# Patient Record
Sex: Female | Born: 1990 | Race: Black or African American | Hispanic: No | Marital: Single | Smoking: Never smoker
Health system: Southern US, Community
[De-identification: ages and names within clinical notes are randomized; demographics above are authoritative.]

## PROBLEM LIST (undated history)

## (undated) HISTORY — PX: WISDOM TOOTH EXTRACTION: SHX21

---
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
## 2010-06-15 ENCOUNTER — Ambulatory Visit: Payer: Self-pay | Admitting: Psychology
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
## 2011-08-02 LAB — BASIC METABOLIC PANEL
CO2: 20
CO2: 22
Calcium: 9.3
Chloride: 110
Glucose, Bld: 63 — ABNORMAL LOW
Potassium: 4.3
Sodium: 137
Sodium: 139

## 2011-08-02 LAB — CBC
Hemoglobin: 12.5
MCHC: 32.7
MCHC: 33
MCV: 79.6
Platelets: 245
RBC: 4.35
RBC: 4.81
RDW: 15.1
WBC: 10
WBC: 7.5

## 2011-08-02 LAB — TYPE AND SCREEN: ABO/RH(D): A POS

## 2011-08-02 LAB — POCT I-STAT 3, ART BLOOD GAS (G3+)
Bicarbonate: 20.9
Bicarbonate: 22.5
O2 Saturation: 58
Operator id: 281221
Operator id: 299431
Patient temperature: 100.1
pCO2 arterial: 40
pH, Arterial: 7.34 — ABNORMAL LOW
pH, Arterial: 7.402 — ABNORMAL HIGH
pO2, Arterial: 33 — CL
pO2, Arterial: 374 — ABNORMAL HIGH

## 2011-08-02 LAB — I-STAT EC8
BUN: 12
Chloride: 107
HCT: 40
pCO2 arterial: 44.9
pH, Arterial: 7.296 — ABNORMAL LOW

## 2011-08-02 LAB — PROTIME-INR: Prothrombin Time: 14.6

## 2011-08-02 LAB — HCG, SERUM, QUALITATIVE: Preg, Serum: NEGATIVE

## 2012-08-01 ENCOUNTER — Ambulatory Visit (INDEPENDENT_AMBULATORY_CARE_PROVIDER_SITE_OTHER): Payer: BC Managed Care – PPO | Admitting: Internal Medicine

## 2012-08-01 DIAGNOSIS — Z789 Other specified health status: Secondary | ICD-10-CM

## 2012-08-04 NOTE — Progress Notes (Signed)
Patient ID: Lisa Stephenson, female   DOB: 1991/02/13, 21 y.o.   MRN: [id number]  Here for pretravel visit.  Going to [place] for 3 months for student exchange.  Never before traveled outside of the country.  Never on airplane.  No medicines and no medical problems.  Here with both parents.  Up to date with vaccinations except flu shot.  Has international medical insurance.    Appropriate advice given regarding safety, money, health, travel.  All questions answered.  To get flu shot at local pharmacy.  To assure that hepatitis A vaccine is up to date, only one is documented.

## 2013-03-02 ENCOUNTER — Encounter: Payer: Self-pay | Admitting: *Deleted

## 2013-03-27 ENCOUNTER — Other Ambulatory Visit (HOSPITAL_COMMUNITY)
Admission: RE | Admit: 2013-03-27 | Discharge: 2013-03-27 | Disposition: A | Discharge status: Home / Self Care | Payer: BC Managed Care – PPO | Source: Ambulatory Visit | Attending: Family Medicine | Admitting: Family Medicine

## 2013-03-27 ENCOUNTER — Other Ambulatory Visit: Payer: Self-pay | Admitting: Family Medicine

## 2013-03-27 DIAGNOSIS — Z124 Encounter for screening for malignant neoplasm of cervix: Secondary | ICD-10-CM | POA: Insufficient documentation

## 2019-01-07 ENCOUNTER — Ambulatory Visit (INDEPENDENT_AMBULATORY_CARE_PROVIDER_SITE_OTHER): Payer: BC Managed Care – PPO | Admitting: Family Medicine

## 2019-01-07 ENCOUNTER — Encounter: Payer: Self-pay | Admitting: Family Medicine

## 2019-01-07 VITALS — BP 132/78 | HR 94 | Resp 17 | Ht 61.0 in | Wt 181.0 lb

## 2019-01-07 DIAGNOSIS — Z3202 Encounter for pregnancy test, result negative: Secondary | ICD-10-CM

## 2019-01-07 DIAGNOSIS — Z13220 Encounter for screening for lipoid disorders: Secondary | ICD-10-CM

## 2019-01-07 DIAGNOSIS — Z7689 Persons encountering health services in other specified circumstances: Secondary | ICD-10-CM

## 2019-01-07 DIAGNOSIS — E669 Obesity, unspecified: Secondary | ICD-10-CM | POA: Diagnosis not present

## 2019-01-07 DIAGNOSIS — Z131 Encounter for screening for diabetes mellitus: Secondary | ICD-10-CM

## 2019-01-07 DIAGNOSIS — Z6834 Body mass index (BMI) 34.0-34.9, adult: Secondary | ICD-10-CM

## 2019-01-07 DIAGNOSIS — Z1329 Encounter for screening for other suspected endocrine disorder: Secondary | ICD-10-CM

## 2019-01-07 DIAGNOSIS — Z1389 Encounter for screening for other disorder: Secondary | ICD-10-CM | POA: Diagnosis not present

## 2019-01-07 DIAGNOSIS — Z862 Personal history of diseases of the blood and blood-forming organs and certain disorders involving the immune mechanism: Secondary | ICD-10-CM | POA: Diagnosis not present

## 2019-01-07 DIAGNOSIS — Z0000 Encounter for general adult medical examination without abnormal findings: Secondary | ICD-10-CM

## 2019-01-07 LAB — POCT URINALYSIS DIP (CLINITEK)
Bilirubin, UA: NEGATIVE
Blood, UA: NEGATIVE
Glucose, UA: NEGATIVE mg/dL
Ketones, POC UA: NEGATIVE mg/dL
Leukocytes, UA: NEGATIVE
Nitrite, UA: NEGATIVE
POC PROTEIN,UA: NEGATIVE
Spec Grav, UA: 1.025 (ref 1.010–1.025)
Urobilinogen, UA: 0.2 E.U./dL
pH, UA: 6 (ref 5.0–8.0)

## 2019-01-07 LAB — POCT URINE PREGNANCY: Preg Test, Ur: NEGATIVE

## 2019-01-07 NOTE — Patient Instructions (Incomplete)
Thank you for choosing Primary Care at [facility] to be your medical home!    Lisa Stephenson was seen by Joaquin Courts, FNP today.   Lisa Stephenson's primary care provider is Bing Neighbors, FNP.   For the best care possible, you should try to see Joaquin Courts, FNP-C whenever you come to the clinic.   We look forward to seeing you again soon!  If you have any questions about your visit today, please call us at [phone] or feel free to reach your primary care provider via MyChart.   Marland KitchenKeeping You Healthy  Get These Tests 1. Blood Pressure- Have your blood pressure checked once a year by your health care provider.  Normal blood pressure is 120/80. 2. Weight- Have your body mass index (BMI) calculated to screen for obesity.  BMI is measure of body fat based on height and weight.  You can also calculate your own BMI at https://www.west-esparza.com/. 3. Cholesterol- Have your cholesterol checked every 5 years starting at age 6 then yearly starting at age 49. 4. Chlamydia, HIV, and other sexually transmitted diseases- Get screened every year until age 28, then within three months of each new sexual provider. 5. Pap Test - Every 1-5 years; discuss with your health care provider. 6. Mammogram- Every 1-2 years starting at age 11--50  Take these medicines  Calcium with Vitamin D-Your body needs 1200 mg of Calcium each day and [phone] IU of Vitamin D daily.  Your body can only absorb 500 mg of Calcium at a time so Calcium must be taken in 2 or 3 divided doses throughout the day.  Multivitamin with folic acid- Once daily if it is possible for you to become pregnant.  Get these Immunizations  Gardasil-Series of three doses; prevents HPV related illness such as genital warts and cervical cancer.  Menactra-Single dose; prevents meningitis.  Tetanus shot- Every 10 years.  Flu shot-Every year.  Take these steps 1. Do not smoke-Your healthcare provider can help you quit.  For tips  on how to quit go to www.smokefree.gov or call 1-800 QUITNOW. 2. Be physically active- Exercise 5 days a week for at least 30 minutes.  If you are not already physically active, start slow and gradually work up to 30 minutes of moderate physical activity.  Examples of moderate activity include walking briskly, dancing, swimming, bicycling, etc. 3. Breast Cancer- A self breast exam every month is important for early detection of breast cancer.  For more information and instruction on self breast exams, ask your healthcare provider or [place].es. 4. Eat a healthy diet- Eat a variety of healthy foods such as fruits, vegetables, whole grains, low fat milk, low fat cheeses, yogurt, lean meats, poultry and fish, beans, nuts, tofu, etc.  For more information go to www. Thenutritionsource.org 5. Drink alcohol in moderation- Limit alcohol intake to one drink or less per day. Never drink and drive. 6. Depression- Your emotional health is as important as your physical health.  If you're feeling down or losing interest in things you normally enjoy please talk to your healthcare provider about being screened for depression. 7. Dental visit- Brush and floss your teeth twice daily; visit your dentist twice a year. 8. Eye doctor- Get an eye exam at least every 2 years. 9. Helmet use- Always wear a helmet when riding a bicycle, motorcycle, rollerblading or skateboarding. 10. Safe sex- If you may be exposed to sexually transmitted infections, use a condom. 11. Seat belts- Seat belts can save your live;  always wear one. 12. Smoke/Carbon Monoxide detectors- These detectors need to be installed on the appropriate level of your home. Replace batteries at least once a year. 13. Skin cancer- When out in the sun please cover up and use sunscreen 15 SPF or higher. 14. Violence- If anyone is threatening or hurting you, please tell your healthcare provider.
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
## 2019-01-08 ENCOUNTER — Other Ambulatory Visit: Payer: Self-pay | Admitting: Family Medicine

## 2019-01-08 ENCOUNTER — Telehealth: Payer: Self-pay | Admitting: Family Medicine

## 2019-01-08 LAB — HEMOGLOBIN A1C
Est. average glucose Bld gHb Est-mCnc: 123 mg/dL
Hgb A1c MFr Bld: 5.9 % — ABNORMAL HIGH (ref 4.8–5.6)

## 2019-01-08 LAB — CBC WITH DIFFERENTIAL/PLATELET
BASOS ABS: 0 10*3/uL (ref 0.0–0.2)
Basos: 1 %
EOS (ABSOLUTE): 0 10*3/uL (ref 0.0–0.4)
Eos: 0 %
Hematocrit: 30.3 % — ABNORMAL LOW (ref 34.0–46.6)
Hemoglobin: 9 g/dL — ABNORMAL LOW (ref 11.1–15.9)
IMMATURE GRANS (ABS): 0 10*3/uL (ref 0.0–0.1)
Immature Granulocytes: 0 %
Lymphocytes Absolute: 2.2 10*3/uL (ref 0.7–3.1)
Lymphs: 35 %
MCH: 20.4 pg — ABNORMAL LOW (ref 26.6–33.0)
MCHC: 29.7 g/dL — ABNORMAL LOW (ref 31.5–35.7)
MCV: 69 fL — ABNORMAL LOW (ref 79–97)
Monocytes Absolute: 0.4 10*3/uL (ref 0.1–0.9)
Monocytes: 6 %
NEUTROS ABS: 3.6 10*3/uL (ref 1.4–7.0)
Neutrophils: 58 %
Platelets: 547 10*3/uL — ABNORMAL HIGH (ref 150–450)
RBC: 4.42 x10E6/uL (ref 3.77–5.28)
RDW: 17 % — ABNORMAL HIGH (ref 11.7–15.4)
WBC: 6.2 10*3/uL (ref 3.4–10.8)

## 2019-01-08 LAB — IRON,TIBC AND FERRITIN PANEL
Ferritin: 4 ng/mL — ABNORMAL LOW (ref 15–150)
Iron Saturation: 3 % — CL (ref 15–55)
Iron: 14 ug/dL — ABNORMAL LOW (ref 27–159)
Total Iron Binding Capacity: 443 ug/dL (ref 250–450)
UIBC: 429 ug/dL — ABNORMAL HIGH (ref 131–425)

## 2019-01-08 LAB — COMPREHENSIVE METABOLIC PANEL
ALT: 30 IU/L (ref 0–32)
AST: 16 IU/L (ref 0–40)
Albumin/Globulin Ratio: 1.5 (ref 1.2–2.2)
Albumin: 4.5 g/dL (ref 3.9–5.0)
Alkaline Phosphatase: 108 IU/L (ref 39–117)
BUN/Creatinine Ratio: 11 (ref 9–23)
BUN: 9 mg/dL (ref 6–20)
Bilirubin Total: <0.2 mg/dL (ref 0.0–1.2)
CO2: 23 mmol/L (ref 20–29)
Calcium: 10 mg/dL (ref 8.7–10.2)
Chloride: 104 mmol/L (ref 96–106)
Creatinine, Ser: 0.81 mg/dL (ref 0.57–1.00)
GFR calc Af Amer: 114 mL/min/{1.73_m2} (ref >59)
GFR calc non Af Amer: 99 mL/min/{1.73_m2} (ref >59)
Globulin, Total: 3.1 g/dL (ref 1.5–4.5)
Glucose: 87 mg/dL (ref 65–99)
Potassium: 4.6 mmol/L (ref 3.5–5.2)
Sodium: 141 mmol/L (ref 134–144)
Total Protein: 7.6 g/dL (ref 6.0–8.5)

## 2019-01-08 LAB — LIPID PANEL
Chol/HDL Ratio: 3.6 ratio (ref 0.0–4.4)
Cholesterol, Total: 223 mg/dL — ABNORMAL HIGH (ref 100–199)
HDL: 62 mg/dL (ref >39)
LDL Calculated: 144 mg/dL — ABNORMAL HIGH (ref 0–99)
Triglycerides: 83 mg/dL (ref 0–149)
VLDL CHOLESTEROL CAL: 17 mg/dL (ref 5–40)

## 2019-01-08 LAB — THYROID PANEL WITH TSH
FREE THYROXINE INDEX: 2.1 (ref 1.2–4.9)
T3 Uptake Ratio: 27 % (ref 24–39)
T4, Total: 7.9 ug/dL (ref 4.5–12.0)
TSH: 1.39 u[IU]/mL (ref 0.450–4.500)

## 2019-01-08 MED ORDER — FERROUS SULFATE 325 (65 FE) MG PO TABS: 325.0000 mg | ORAL_TABLET | Freq: Three times a day (TID) | ORAL | 3 refills | Status: DC

## 2019-01-08 NOTE — Telephone Encounter (Signed)
Patient notified of lab results

## 2019-01-08 NOTE — Progress Notes (Signed)
Ferrous 325 mg TID ordered and sent to pharmacy on file.

## 2019-01-08 NOTE — Telephone Encounter (Signed)
Please contact patient to advise her iron level was extremely low. I would like her to resume oral iron tablets 3 times daily until her follow-up with me in office on March.  I will recheck her hemoglobin and iron level at that time. Her hemoglobin is also low at 9 (should be 11.1). It is important that she take iron daily 3 times daily to increase iron and hemoglobin level.  Other lab: thyroid, kidney, liver, and electrolyte function is normal. Hemoglobin A1C indicates prediabetes at 5.9 and cholesterol (total cholesterol and bad cholesterol) were both elevated. No medication indicated to manage cholesterol or prediabetes. Presently, I recommend lifestyle changes such as engaging in routine physical activity with a goal of 150 minutes per week, increasing intake of vegetables, fruits, fiber, and selecting lean cuts of meat.    Joaquin Courts, FNP Primary Care at [facility] [address] [place] [address] [phone]: [phone]

## 2019-01-08 NOTE — Telephone Encounter (Signed)
Left voice mail to call back 

## 2019-02-04 ENCOUNTER — Ambulatory Visit: Payer: BC Managed Care – PPO | Admitting: Family Medicine

## 2019-08-20 ENCOUNTER — Ambulatory Visit (INDEPENDENT_AMBULATORY_CARE_PROVIDER_SITE_OTHER): Payer: BC Managed Care – PPO

## 2019-08-20 ENCOUNTER — Other Ambulatory Visit: Payer: Self-pay

## 2019-08-20 ENCOUNTER — Encounter: Payer: Self-pay | Admitting: Emergency Medicine

## 2019-08-20 ENCOUNTER — Ambulatory Visit
Admission: EM | Admit: 2019-08-20 | Discharge: 2019-08-20 | Disposition: A | Discharge status: Home / Self Care | Payer: BC Managed Care – PPO | Attending: Physician Assistant | Admitting: Physician Assistant

## 2019-08-20 DIAGNOSIS — M25562 Pain in left knee: Secondary | ICD-10-CM

## 2019-08-20 MED ORDER — MELOXICAM 7.5 MG PO TABS: 7.5000 mg | ORAL_TABLET | Freq: Every day | ORAL | 0 refills | Status: DC

## 2019-08-20 NOTE — ED Provider Notes (Signed)
[facility]    CSN: [id number] Arrival date & time: 08/20/19  3267      History   Chief Complaint Chief Complaint  Patient presents with  . Knee Pain    HPI Lisa Stephenson is a 28 y.o. female.   28 year old female comes in for left knee pain after hyperextended knee last night. States was stepping on the curb when she hyperextended, and has not been able to bear weight since. She has swelling and tenderness inferior to the patella. She is able to flex and extend, though with pain. Denies radiation of pain, numbness/tingling.  States range of motion, swelling, pain has improved from last night, but still unable to weight-bear fully.     History reviewed. No pertinent past medical history.  There are no active problems to display for this patient.   Past Surgical History:  Procedure Laterality Date  . WISDOM TOOTH EXTRACTION      OB History   No obstetric history on file.      Home Medications    Prior to Admission medications   Medication Sig Start Date End Date Taking? Authorizing Provider  ferrous sulfate (FERROUSUL) 325 (65 FE) MG tablet Take 1 tablet (325 mg total) by mouth 3 (three) times daily with meals. 01/08/19   Scot Jun, FNP  meloxicam (MOBIC) 7.5 MG tablet Take 1 tablet (7.5 mg total) by mouth daily. 08/20/19   Tasia Catchings, Ralph Benavidez V, PA-C  Multiple Vitamin (MULTIVITAMIN) tablet Take 1 tablet by mouth daily.    [provider]    Family History Family History  Problem Relation Age of Onset  . Hyperlipidemia Mother   . Hypertension Mother   . Hyperlipidemia Father   . Diabetes Paternal Uncle     Social History Social History   Tobacco Use  . Smoking status: Never Smoker  . Smokeless tobacco: Never Used  Substance Use Topics  . Alcohol use: Yes  . Drug use: Never     Allergies   Patient has no known allergies.   Review of Systems Review of Systems  Reason unable to perform ROS: See HPI as above.     Physical  Exam Triage Vital Signs ED Triage Vitals  Enc Vitals Group     BP 08/20/19 0903 (!) 151/99     Pulse Rate 08/20/19 0903 75     Resp 08/20/19 0903 16     Temp 08/20/19 0903 98.5 F (36.9 C)     Temp Source 08/20/19 0903 Oral     SpO2 08/20/19 0903 98 %     Weight --      Height --      Head Circumference --      Peak Flow --      Pain Score 08/20/19 0905 7     Pain Loc --      Pain Edu? --      Excl. in [place]? --    No data found.  Updated Vital Signs BP (!) 151/99 (BP Location: Left Arm)   Pulse 75   Temp 98.5 F (36.9 C) (Oral)   Resp 16   LMP 08/13/2019   SpO2 98%   Physical Exam Constitutional:      General: She is not in acute distress.    Appearance: She is well-developed. She is not diaphoretic.  HENT:     Head: Normocephalic and atraumatic.  Eyes:     Conjunctiva/sclera: Conjunctivae normal.     Pupils: Pupils are equal, round,  and reactive to light.  Pulmonary:     Effort: Pulmonary effort is normal. No respiratory distress.  Musculoskeletal:     Comments: Swelling to the medial left knee without erythema, warmth, contusion.  Tenderness to palpation of patellar tendon/tibial tuberosity, lateral joint line.  Full range of motion of the knee.  Strength deferred.  Sensation intact and equal bilaterally.  Neurological:     Mental Status: She is alert and oriented to person, place, and time.      UC Treatments / Results  Labs (all labs ordered are listed, but only abnormal results are displayed) Labs Reviewed - No data to display  EKG   Radiology Dg Knee Complete 4 Views Left  Result Date: 08/20/2019 CLINICAL DATA:  Hyperextension, unable to bear weight EXAM: LEFT KNEE - COMPLETE 4+ VIEW COMPARISON:  None. FINDINGS: No fracture or dislocation of the left knee. Joint spaces are well preserved. There is a large, nonspecific knee joint effusion. Soft tissues are unremarkable. IMPRESSION: No fracture or dislocation of the left knee. Joint spaces are well  preserved. There is a large, nonspecific knee joint effusion. Electronically Signed   By: Lauralyn Primes M.D.   On: 08/20/2019 09:51    Procedures Procedures (including critical care time)  Medications Ordered in UC Medications - No data to display  Initial Impression / Assessment and Plan / UC Course  I have reviewed the triage vital signs and the nursing notes.  Pertinent labs & imaging results that were available during my care of the patient were reviewed by me and considered in my medical decision making (see chart for details).    X-ray negative for fracture or dislocation.  Discussed crutches, knee sleeve, NSAIDs, ice compress, rest.  Patient declined crutches and knee sleeves.  Will start patient on Mobic and patient to follow up with orthopedics for further evaluation needed. Return precautions given.  Final Clinical Impressions(s) / UC Diagnoses   Final diagnoses:  Acute pain of left knee   ED Prescriptions    Medication Sig Dispense Auth. Provider   meloxicam (MOBIC) 7.5 MG tablet Take 1 tablet (7.5 mg total) by mouth daily. 14 tablet Belinda Fisher, PA-C     PDMP not reviewed this encounter.   Belinda Fisher, PA-C 08/20/19 1302

## 2019-08-20 NOTE — ED Triage Notes (Signed)
Patient presents to [facility] for assessment after stepping on the [place] and states her knee hyperextended, andf she has been unable to bear weight since last night.  Patient c/o swelling, and tenderness directly under the patella.

## 2019-08-20 NOTE — Discharge Instructions (Signed)
X-ray negative for fracture or dislocation.  Start Mobic as directed.  Ice compress, elevation, knee sleeve during activity.  Can use crutches for symptomatic relief.  Follow-up with orthopedics if symptoms not improving in 1 week.

## 2019-08-20 NOTE — ED Notes (Signed)
Patient refused crutches and knee sleeve.  States she will find these items on her own.  Ice pack provided for home use.

## 2019-09-14 ENCOUNTER — Telehealth: Payer: Self-pay

## 2019-09-14 NOTE — Telephone Encounter (Signed)

## 2019-09-15 ENCOUNTER — Ambulatory Visit (INDEPENDENT_AMBULATORY_CARE_PROVIDER_SITE_OTHER): Payer: BC Managed Care – PPO | Admitting: Internal Medicine

## 2019-09-15 ENCOUNTER — Encounter: Payer: Self-pay | Admitting: Internal Medicine

## 2019-09-15 ENCOUNTER — Other Ambulatory Visit: Payer: Self-pay

## 2019-09-15 VITALS — BP 150/100 | HR 70 | Temp 97.2°F | Resp 17 | Wt 183.0 lb

## 2019-09-15 DIAGNOSIS — Z23 Encounter for immunization: Secondary | ICD-10-CM | POA: Diagnosis not present

## 2019-09-15 DIAGNOSIS — R030 Elevated blood-pressure reading, without diagnosis of hypertension: Secondary | ICD-10-CM

## 2019-09-15 DIAGNOSIS — M25562 Pain in left knee: Secondary | ICD-10-CM

## 2019-09-15 DIAGNOSIS — E66811 Obesity, class 1: Secondary | ICD-10-CM

## 2019-09-15 DIAGNOSIS — E669 Obesity, unspecified: Secondary | ICD-10-CM

## 2019-09-15 DIAGNOSIS — Z6834 Body mass index (BMI) 34.0-34.9, adult: Secondary | ICD-10-CM | POA: Insufficient documentation

## 2019-09-15 DIAGNOSIS — M25561 Pain in right knee: Secondary | ICD-10-CM

## 2019-09-15 MED ORDER — DICLOFENAC SODIUM 1 % TD GEL: 4.0000 g | Freq: Four times a day (QID) | TRANSDERMAL | 1 refills | Status: DC

## 2019-09-15 NOTE — Progress Notes (Signed)
States that she's following up on knee pain. Was seen at urgent care for L knee pain 1 month ago. Hasn't noticed much improvement. R knee is starting to hurt as well from having to accommodate L knee.  Would also like to discuss weight loss. States that her goal weight is 120. States that she is meal planning & eating more at home. Not able to exercise as much due to B knee pain.  Will get flu shot today.

## 2019-09-15 NOTE — Progress Notes (Addendum)
Patient ID: Lisa Stephenson, female    DOB: 07-11-1991  MRN: [id number]  CC: Weight Check   Subjective: Lisa Stephenson is a 28 y.o. female who presents for knee pain and f/u on wgh issue. Her concerns today include:  Patient with history of iron deficiency anemia, HL, prediabetes, obesity  Patient was seen in urgent care 08/20/2019 with left knee pain after she hyperextended the knee while stepping on a curb the night before.  Noted to have swelling and tenderness at that time of exam.  X-ray was done and revealed large knee joint effusion but no fracture or dislocation.  Patient was advised on using crutches and a knee sleeve but she declined those.  She was given meloxicam and told to follow-up with orthopedics.  Took Meloxicam for 2 wks. -Meloxicam helped but was making her dizzy and tired.  -She did ice it and wore a knee support.  The swelling went down. However, she still gets some pain in the knee with any amount of walking.   Some pain in RT knee now.  She has been putting more wgh on the RT knee.   She was walking 2 miles 4 days a week for exercise prior to this injury.  She would like to get back to her baseline so that she can start exercising again.     Wanting to get wgh down.  Goal is to get to 120 lbs Before knee injury she was walking 2 miles 4 days a wk.  After injury she started doing upper body exercises.  Cooking more at home and eating more veggies.  She has cut out sodas.   BP elev today and on visit to UC last mth.  No chronic HA, SOB, CP.  Mom has HTN Took some Aleve twice last wk.     Current Outpatient Medications on File Prior to Visit  Medication Sig Dispense Refill  . ferrous sulfate (FERROUSUL) 325 (65 FE) MG tablet Take 1 tablet (325 mg total) by mouth 3 (three) times daily with meals. 90 tablet 3  . Multiple Vitamin (MULTIVITAMIN) tablet Take 1 tablet by mouth daily.     No current facility-administered medications on file prior to visit.     No  Known Allergies  Social History   Socioeconomic History  . Marital status: Single    Spouse name: Not on file  . Number of children: Not on file  . Years of education: Not on file  . Highest education level: Not on file  Occupational History  . Not on file  Social Needs  . Financial resource strain: Not on file  . Food insecurity    Worry: Not on file    Inability: Not on file  . Transportation needs    Medical: Not on file    Non-medical: Not on file  Tobacco Use  . Smoking status: Never Smoker  . Smokeless tobacco: Never Used  Substance and Sexual Activity  . Alcohol use: Yes  . Drug use: Never  . Sexual activity: Not on file  Lifestyle  . Physical activity    Days per week: Not on file    Minutes per session: Not on file  . Stress: Not on file  Relationships  . Social Herbalist on phone: Not on file    Gets together: Not on file    Attends religious service: Not on file    Active member of club or organization: Not on file  Attends meetings of clubs or organizations: Not on file    Relationship status: Not on file  . Intimate partner violence    Fear of current or ex partner: Not on file    Emotionally abused: Not on file    Physically abused: Not on file    Forced sexual activity: Not on file  Other Topics Concern  . Not on file  Social History Narrative  . Not on file    Family History  Problem Relation Age of Onset  . Hyperlipidemia Mother   . Hypertension Mother   . Hyperlipidemia Father   . Diabetes Paternal Uncle     Past Surgical History:  Procedure Laterality Date  . WISDOM TOOTH EXTRACTION      ROS: Review of Systems Negative except as stated above  PHYSICAL EXAM: BP (!) 155/100   Pulse 70   Temp (!) 97.2 F (36.2 C) (Temporal)   Resp 17   Wt 183 lb (83 kg)   LMP 08/14/2019 (Exact Date)   SpO2 99%   BMI 34.58 kg/m   Wt Readings from Last 3 Encounters:  09/15/19 183 lb (83 kg)  01/07/19 181 lb (82.1 kg)    Physical Exam  General appearance - alert, well appearing, young African-American female and in no distress Mental status - normal mood, behavior, speech, dress, motor activity, and thought processes Neck - supple, no significant adenopathy Chest - clear to auscultation, no wheezes, rales or rhonchi, symmetric air entry Heart - normal rate, regular rhythm, normal S1, S2, no murmurs, rubs, clicks or gallops Musculoskeletal -left knee: No soft tissue swelling noted at this time.  No point tenderness on palpation of the medial and lateral joint lines.  She has very good passive range of motion.  No laxity of the joint.  No discomfort on medial and lateral strain.  Right knee: Slight soft tissue swelling.  No point tenderness on palpation of the medial and lateral joint lines.  Good passive range of motion.  Drawer test negative.  No discomfort on medial and lateral strain maneuvers.   Extremities -no lower extremity edema   CMP Latest Ref Rng & Units 01/07/2019 11/19/2007 11/18/2007  Glucose 65 - 99 mg/dL 87 16(W) 97  BUN 6 - 20 mg/dL 9 7 2(L)  Creatinine 7.37 - 1.00 mg/dL 1.06 2.69 4.85  Sodium 134 - 144 mmol/L 141 139 137  Potassium 3.5 - 5.2 mmol/L 4.6 4.3 3.7 SLIGHT HEMOLYSIS  Chloride 96 - 106 mmol/L 104 110 108  CO2 20 - 29 mmol/L 23 20 22   Calcium 8.7 - 10.2 mg/dL 9.7 9.3  Total Protein 6.0 - 8.5 g/dL 7.6 - -  Total Bilirubin 0.0 - 1.2 mg/dL 46.2 - -  Alkaline Phos 39 - 117 IU/L 108 - -  AST 0 - 40 IU/L 16 - -  ALT 0 - 32 IU/L 30 - -   Lipid Panel     Component Value Date/Time   CHOL 223 (H) 01/07/2019 1638   TRIG 83 01/07/2019 1638   HDL 62 01/07/2019 1638   CHOLHDL 3.6 01/07/2019 1638   LDLCALC 144 (H) 01/07/2019 1638    CBC    Component Value Date/Time   WBC 6.2 01/07/2019 1638   WBC 7.5 11/19/2007 1055   RBC 4.42 01/07/2019 1638   RBC 4.81 11/19/2007 1055   HGB 9.0 (L) 01/07/2019 1638   HCT 30.3 (L) 01/07/2019 1638   PLT 547 (H) 01/07/2019 1638   MCV 69 (L)  01/07/2019  1638   MCH 20.4 (L) 01/07/2019 1638   MCHC 29.7 (L) 01/07/2019 1638   MCHC 32.7 11/19/2007 1055   RDW 17.0 (H) 01/07/2019 1638   LYMPHSABS 2.2 01/07/2019 1638   EOSABS 0.0 01/07/2019 1638   BASOSABS 0.0 01/07/2019 1638    ASSESSMENT AND PLAN: 1. Acute pain of both knees Recommend using some Voltaren gel as needed.  Also recommend Tylenol as needed.  Will refer to sports medicine to see if anything else needs to be done at this point to help her get back to her baseline. - Ambulatory referral to Sports Medicine  2. Elevated blood pressure reading Both systolic and diastolic blood pressure is elevated.  Advised to avoid taking NSAIDs at this time as some people's blood pressure sensitive to the effects of NSAIDs.  DASH diet discussed and encouraged.  She has access to blood pressure monitoring device through her mother.  I have asked her to check her blood pressure at least twice a week and bring in readings on follow-up visit in 2 weeks.  If still elevated we will start her on medication  3. Obesity Dietary counseling given.  She is agreeable to seeing a nutritionist for more formal counseling.  I have referred her to sports medicine for her knees to see what other measures can be done to help get her back to her baseline so that she can start exercising again - Amb ref to Medical Nutrition Therapy-MNT  4. Needs flu shot - Flu Vaccine QUAD 6+ mos PF IM (Fluarix Quad PF)     Patient was given the opportunity to ask questions.  Patient verbalized understanding of the plan and was able to repeat key elements of the plan.   Orders Placed This Encounter  Procedures  . Flu Vaccine QUAD 6+ mos PF IM (Fluarix Quad PF)     Requested Prescriptions    No prescriptions requested or ordered in this encounter    No follow-ups on file.  Jonah Blueeborah Johnson, MD, FACP

## 2019-09-15 NOTE — Patient Instructions (Signed)
Try to limit salt in the foods. Try to check your blood pressure at least twice a week and write down the numbers.  Bring it with you on your next visit.    Use the Voltaren Gel on your knees.  Okay to take Tylenol also as needed.  I have referred you to a Sports medicine specialist.   Obesity, Adult Obesity is having too much body fat. Being obese means that your weight is more than what is healthy for you. BMI is a number that explains how much body fat you have. If you have a BMI of 30 or more, you are obese. Obesity is often caused by eating or drinking more calories than your body uses. Changing your lifestyle can help you lose weight. Obesity can cause serious health problems, such as:  Stroke.  Coronary artery disease (CAD).  Type 2 diabetes.  Some types of cancer, including cancers of the colon, breast, uterus, and gallbladder.  Osteoarthritis.  High blood pressure (hypertension).  High cholesterol.  Sleep apnea.  Gallbladder stones.  Infertility problems. What are the causes?  Eating meals each day that are high in calories, sugar, and fat.  Being born with genes that may make you more likely to become obese.  Having a medical condition that causes obesity.  Taking certain medicines.  Sitting a lot (having a sedentary lifestyle).  Not getting enough sleep.  Drinking a lot of drinks that have sugar in them. What increases the risk?  Having a family history of obesity.  Being an [place] American woman.  Being a Hispanic man.  Living in an area with limited access to: ? Romilda Garret, recreation centers, or sidewalks. ? Healthy food choices, such as grocery stores and farmers' markets. What are the signs or symptoms? The main sign is having too much body fat. How is this treated?  Treatment for this condition often includes changing your lifestyle. Treatment may include: ? Changing your diet. This may include making a healthy meal plan. ? Exercise. This may  include activity that causes your heart to beat faster (aerobic exercise) and strength training. Work with your doctor to design a program that works for you. ? Medicine to help you lose weight. This may be used if you are not able to lose 1 pound a week after 6 weeks of healthy eating and more exercise. ? Treating conditions that cause the obesity. ? Surgery. Options may include gastric banding and gastric bypass. This may be done if:  Other treatments have not helped to improve your condition.  You have a BMI of 40 or higher.  You have life-threatening health problems related to obesity. Follow these instructions at home: Eating and drinking   Follow advice from your doctor about what to eat and drink. Your doctor may tell you to: ? Limit fast food, sweets, and processed snack foods. ? Choose low-fat options. For example, choose low-fat milk instead of whole milk. ? Eat 5 or more servings of fruits or vegetables each day. ? Eat at home more often. This gives you more control over what you eat. ? Choose healthy foods when you eat out. ? Learn to read food labels. This will help you learn how much food is in 1 serving. ? Keep low-fat snacks available. ? Avoid drinks that have a lot of sugar in them. These include soda, fruit juice, iced tea with sugar, and flavored milk.  Drink enough water to keep your pee (urine) pale yellow.  Do not go on  fad diets. Physical activity  Exercise often, as told by your doctor. Most adults should get up to 150 minutes of moderate-intensity exercise every week.Ask your doctor: ? What types of exercise are safe for you. ? How often you should exercise.  Warm up and stretch before being active.  Do slow stretching after being active (cool down).  Rest between times of being active. Lifestyle  Work with your doctor and a food expert (dietitian) to set a weight-loss goal that is best for you.  Limit your screen time.  Find ways to reward  yourself that do not involve food.  Do not drink alcohol if: ? Your doctor tells you not to drink. ? You are pregnant, may be pregnant, or are planning to become pregnant.  If you drink alcohol: ? Limit how much you use to:  0-1 drink a day for women.  0-2 drinks a day for men. ? Be aware of how much alcohol is in your drink. In the [place] one drink equals one 12 oz bottle of beer (355 mL), one 5 oz glass of wine (148 mL), or one 1 oz glass of hard liquor (44 mL). General instructions  Keep a weight-loss journal. This can help you keep track of: ? The food that you eat. ? How much exercise you get.  Take over-the-counter and prescription medicines only as told by your doctor.  Take vitamins and supplements only as told by your doctor.  Think about joining a support group.  Keep all follow-up visits as told by your doctor. This is important. Contact a doctor if:  You cannot meet your weight loss goal after you have changed your diet and lifestyle for 6 weeks. Get help right away if you:  Are having trouble breathing.  Are having thoughts of harming yourself. Summary  Obesity is having too much body fat.  Being obese means that your weight is more than what is healthy for you.  Work with your doctor to set a weight-loss goal.  Get regular exercise as told by your doctor. This information is not intended to replace advice given to you by your health care provider. Make sure you discuss any questions you have with your health care provider. Document Released: 01/21/2012 Document Revised: 07/03/2018 Document Reviewed: 07/03/2018 Elsevier Patient Education  2020 [employer].

## 2019-09-16 ENCOUNTER — Telehealth: Payer: Self-pay | Admitting: Family Medicine

## 2019-09-16 NOTE — Telephone Encounter (Signed)
Pamala Hurry called stating there is an error in the diagnoses code on referral. Please follow up.

## 2019-09-17 NOTE — Telephone Encounter (Signed)
Good Afternoon . Can you please, forwarder to her pcp Thanks

## 2019-09-17 NOTE — Telephone Encounter (Signed)
Forward per [employer]

## 2019-09-17 NOTE — Telephone Encounter (Signed)
General 09/16/2019 4:30 PM Karma Ganja - -  Note   msg left at drs office to correct dx code from z-code not billable for our dept--bwm

## 2019-09-18 ENCOUNTER — Encounter: Payer: Self-pay | Admitting: Family Medicine

## 2019-09-18 ENCOUNTER — Ambulatory Visit: Payer: Self-pay

## 2019-09-18 ENCOUNTER — Ambulatory Visit: Payer: BC Managed Care – PPO | Admitting: Family Medicine

## 2019-09-18 ENCOUNTER — Other Ambulatory Visit: Payer: Self-pay

## 2019-09-18 VITALS — BP 118/88 | Ht 62.0 in | Wt 183.0 lb

## 2019-09-18 DIAGNOSIS — M25562 Pain in left knee: Secondary | ICD-10-CM

## 2019-09-18 DIAGNOSIS — M25561 Pain in right knee: Secondary | ICD-10-CM

## 2019-09-18 NOTE — Telephone Encounter (Signed)
They are asking for an alternate diagnosis code. They aren't able to bill using Z codes.

## 2019-09-20 NOTE — Progress Notes (Signed)
  Lisa Stephenson - 28 y.o. female MRN [id number]  Date of birth: 02/27/1991    SUBJECTIVE:      Chief Complaint:/ HPI:  Bilateral but L>R knee pain Stepped up onti a curb a few weeks ago and felt like her left knee (the one extended) twisted and gave way or hyperextended. She fell back onto her right knee. Had some immediate swelling of nee. Has improved but is still sore and painful especially when climbing stairs. Right knee has essentially resolved.   ROS:     Pertinent review of systems: negative for fever or unusual weight change.\ No cough  PERTINENT  PMH / PSH FH / / SH:  Past Medical, Surgical, Social, and Family History Reviewed & Updated in the EMR.  Pertinent findings include:  No knee surgery  OBJECTIVE: BP 118/88   Ht 5\' 2"  (1.575 m)   Wt 183 lb (83 kg)   BMI 33.47 kg/m   Physical Exam:  Vital signs are reviewed. GEN WD WN NAD Knees symmetrical. Bilaterally  FROM. Ligamentously intact to varus and valgus stress. No effusion. Normal lachman and thesaly. Left knee patellar cmpression/grind is painful, right is not. Slight lateral tracking B p[atella but not unusually lax. Negative apprehension test B.  Imaging: reviewed her knee  Films negatve  [place] Left knee: very small amount of fluid in supraptellar pouch. Quad and patellar tendons intact. Lateral and medial menisci intact.  ASSESSMENT & PLAN:  Left knee pain: I suspect she had a subluxation of leftknee and has residual patellar facet bruise. We discussed options. She chose continued further consrvative management and if not fully resolved in 3-4 more weeks will rtc. Would consider imagig CT or MRI) at that time. HEP given.

## 2019-09-20 NOTE — Addendum Note (Signed)
Addended by: Karle Plumber B on: 09/20/2019 12:37 PM   Modules accepted: Orders

## 2019-10-07 ENCOUNTER — Ambulatory Visit: Payer: BC Managed Care – PPO

## 2019-12-25 ENCOUNTER — Ambulatory Visit: Payer: BC Managed Care – PPO | Attending: Internal Medicine

## 2019-12-25 DIAGNOSIS — Z23 Encounter for immunization: Secondary | ICD-10-CM

## 2019-12-25 NOTE — Progress Notes (Signed)
   Covid-19 Vaccination Clinic  Name:  Lisa Stephenson    MRN: [id number] DOB: Apr 25, 1991  12/25/2019  Lisa Stephenson was observed post Covid-19 immunization for 15 minutes without incidence. She was provided with Vaccine Information Sheet and instruction to access the V-Safe system.   Lisa Stephenson was instructed to call 911 with any severe reactions post vaccine: Marland Kitchen Difficulty breathing  . Swelling of your face and throat  . A fast heartbeat  . A bad rash all over your body  . Dizziness and weakness    Immunizations Administered    Name Date Dose VIS Date Route   Pfizer COVID-19 Vaccine 12/25/2019 12:11 PM 0.3 mL 10/23/2019 Intramuscular   Manufacturer: [employer], [employer]   Lot: [id number]   NDC: [address]-8

## 2020-01-16 ENCOUNTER — Ambulatory Visit: Payer: BC Managed Care – PPO | Attending: Internal Medicine

## 2020-01-16 DIAGNOSIS — Z23 Encounter for immunization: Secondary | ICD-10-CM | POA: Insufficient documentation

## 2020-01-16 NOTE — Progress Notes (Signed)
   Covid-19 Vaccination Clinic  Name:  Lisa Stephenson    MRN: [id number] DOB: Dec 06, 1990  01/16/2020  Lisa Stephenson was observed post Covid-19 immunization for 15 minutes without incident. She was provided with Vaccine Information Sheet and instruction to access the V-Safe system.   Lisa Stephenson was instructed to call 911 with any severe reactions post vaccine: Marland Kitchen Difficulty breathing  . Swelling of face and throat  . A fast heartbeat  . A bad rash all over body  . Dizziness and weakness   Immunizations Administered    Name Date Dose VIS Date Route   Pfizer COVID-19 Vaccine 01/16/2020  3:32 PM 0.3 mL 10/23/2019 Intramuscular   Manufacturer: [employer], [employer]   Lot: [id number]   NDC: [address]-5

## 2020-02-12 IMAGING — DX DG KNEE COMPLETE 4+V*L*
5 series · 5 of 5 positions shown · non-contrast
Comparison: None.

CLINICAL DATA: Hyperextension, unable to bear weight

EXAM:
LEFT KNEE - COMPLETE 4+ VIEW

[knee ap (1 of 3)]
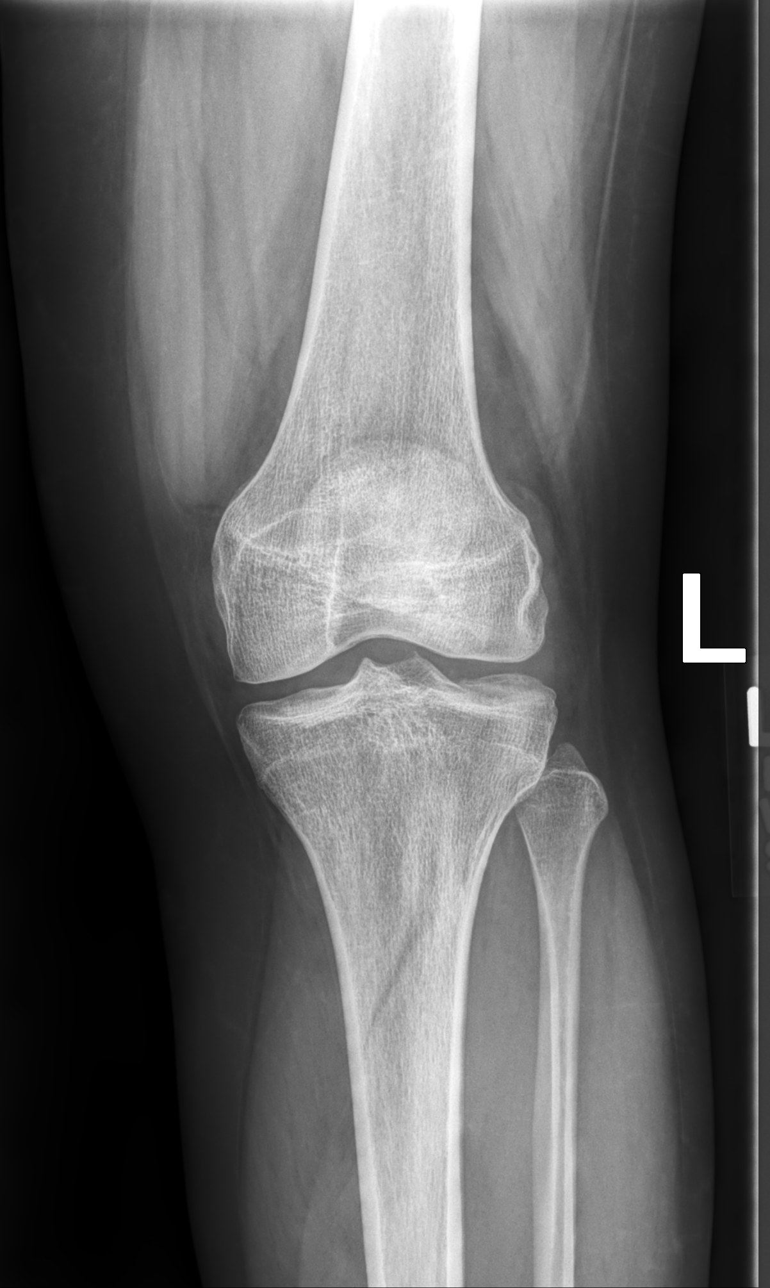

[knee ap (2 of 3)]
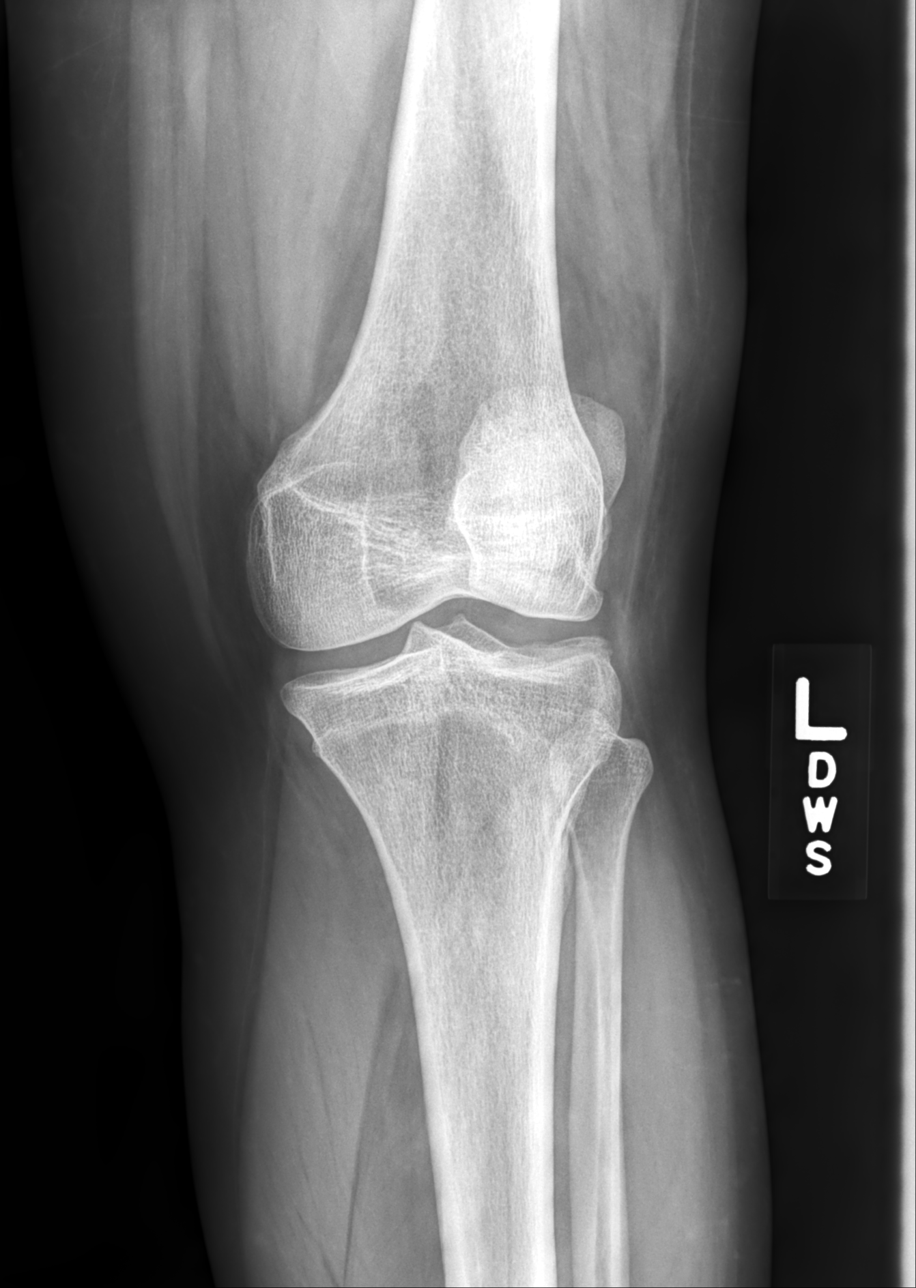

[knee ap (3 of 3)]
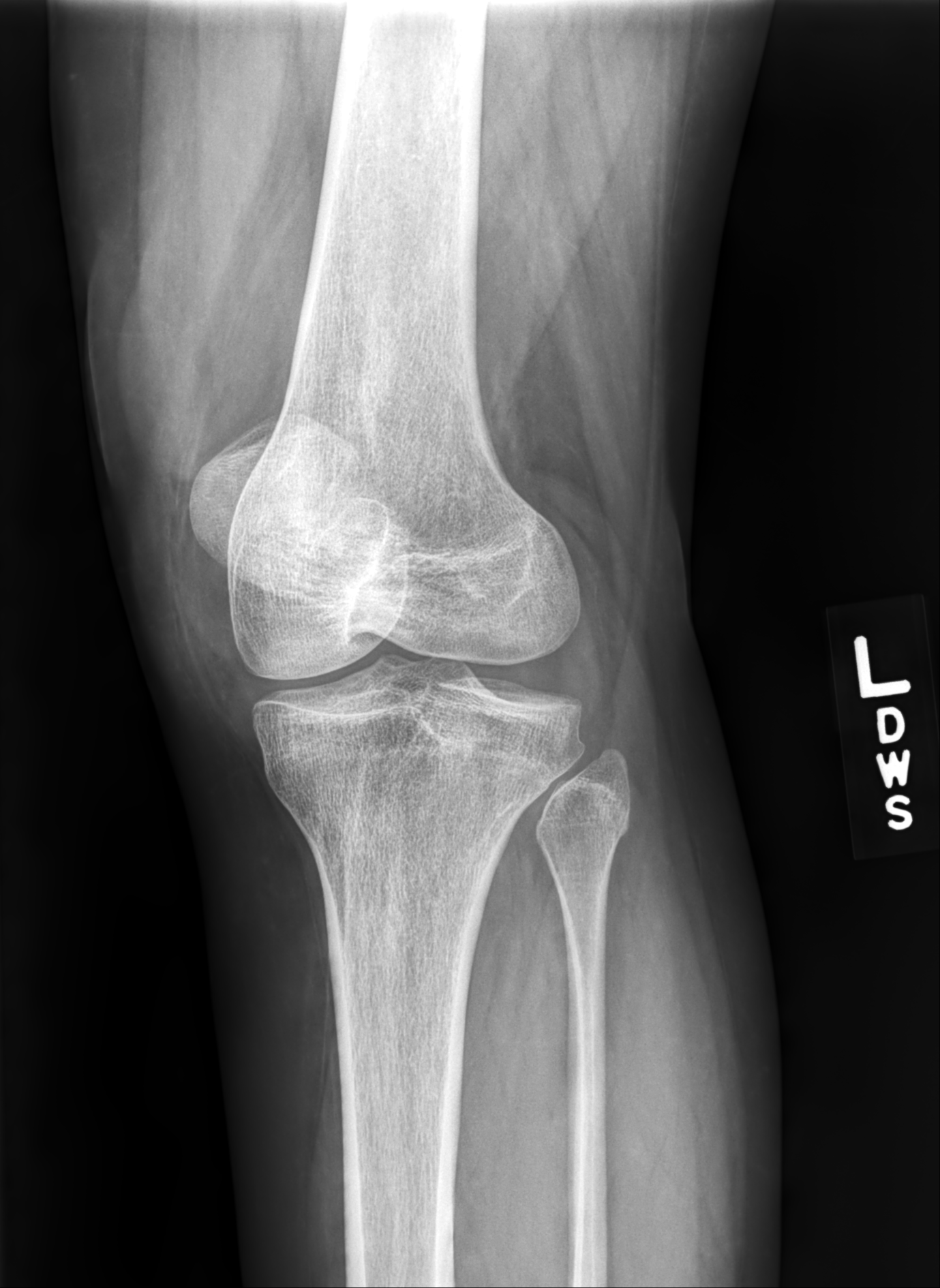

[knee lat]
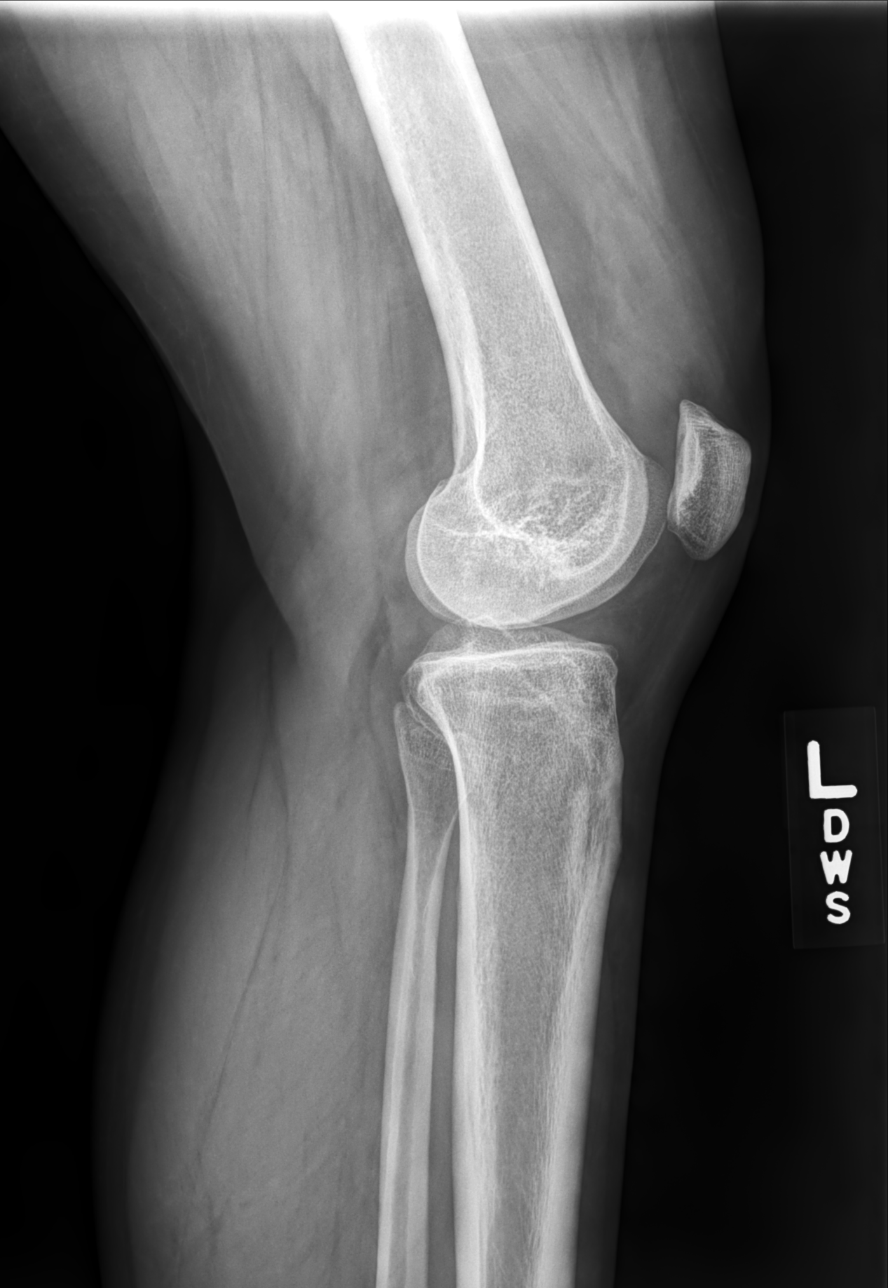

[patella tangential]
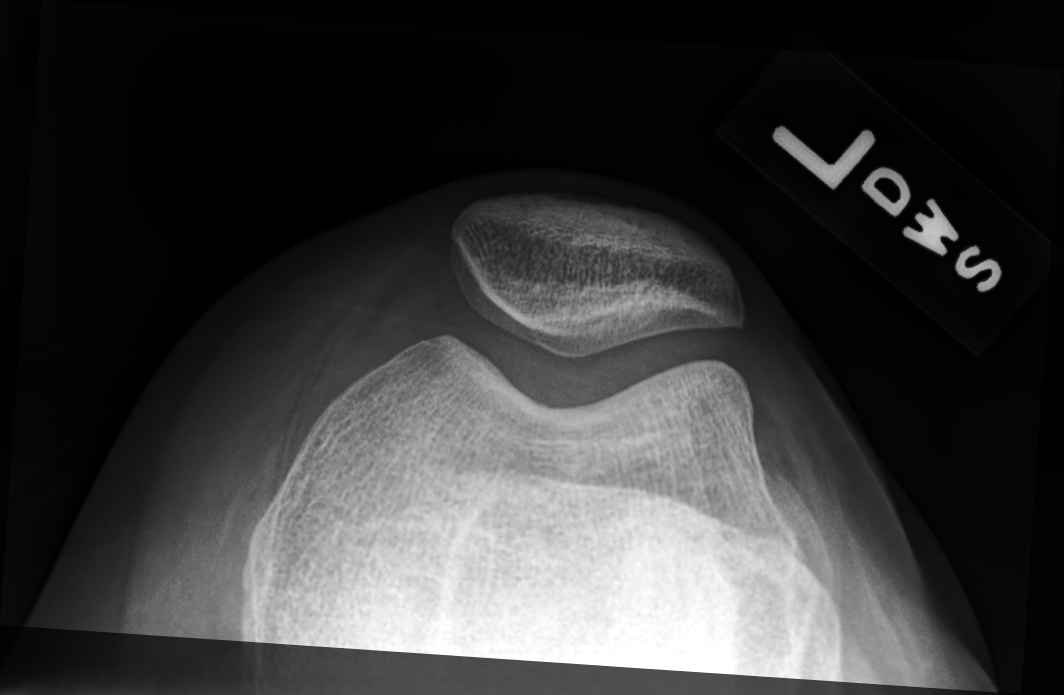

[5 of 5 positions shown; findings below may reference images not displayed]

FINDINGS: No fracture or dislocation of the left knee. Joint spaces are well
preserved. There is a large, nonspecific knee joint effusion. Soft
tissues are unremarkable.
IMPRESSION: No fracture or dislocation of the left knee. Joint spaces are well
preserved. There is a large, nonspecific knee joint effusion.

## 2020-03-01 ENCOUNTER — Encounter: Payer: Self-pay | Admitting: Family Medicine

## 2020-04-04 ENCOUNTER — Telehealth: Payer: BC Managed Care – PPO | Admitting: Internal Medicine

## 2020-12-08 ENCOUNTER — Ambulatory Visit: Payer: BC Managed Care – PPO | Attending: Internal Medicine | Admitting: Internal Medicine

## 2020-12-08 ENCOUNTER — Encounter: Payer: Self-pay | Admitting: Internal Medicine

## 2020-12-08 ENCOUNTER — Other Ambulatory Visit: Payer: Self-pay

## 2020-12-08 VITALS — BP 146/98 | HR 79 | Temp 98.2°F | Resp 16 | Ht 61.0 in | Wt 179.0 lb

## 2020-12-08 DIAGNOSIS — R040 Epistaxis: Secondary | ICD-10-CM | POA: Diagnosis not present

## 2020-12-08 DIAGNOSIS — D500 Iron deficiency anemia secondary to blood loss (chronic): Secondary | ICD-10-CM

## 2020-12-08 DIAGNOSIS — I10 Essential (primary) hypertension: Secondary | ICD-10-CM

## 2020-12-08 DIAGNOSIS — E669 Obesity, unspecified: Secondary | ICD-10-CM | POA: Diagnosis not present

## 2020-12-08 DIAGNOSIS — R7303 Prediabetes: Secondary | ICD-10-CM | POA: Diagnosis not present

## 2020-12-08 MED ORDER — AMLODIPINE BESYLATE 5 MG PO TABS: 5.0000 mg | ORAL_TABLET | Freq: Every day | ORAL | 3 refills | Status: DC

## 2020-12-08 NOTE — Patient Instructions (Signed)

## 2020-12-08 NOTE — Progress Notes (Signed)
Patient ID: Lisa Stephenson, female    DOB: 03/27/1991  MRN: [id number]  CC: New Patient (Initial Visit)   Subjective: Lisa Stephenson is a 30 y.o. female who presents for chronic ds management, I last saw her at [facility] 09/2019 Her concerns today include:  Patient with history of IDA, HL, prediabetes, obesity  C/o having a lot more nose bleeds since 07/2020.  Gets 4-5 episodes/mth since 07/2020. Nostrils get dry during winter. She uses a humidifier in her bedroom. Does not pick nose.  Usually bleed from both sides lasting 30-40 mins.  + clots She pinches the nose and wait it out. She has not had any ER visits for this reason. Denies any easy bruising or bleeding from gums.  BP elev today as was on last visit She limits salt in foods as discussed on last visit NO CP/SOB/HA/dizziness  PreDM/Obesity: working on diet - cut out fried foods, eating more salads and veggies every day. She would like referral to see a nutritionist now that she has insurance. Working out 30-40 minutes 5 times a wk for past 3 mths  IDA:  Cycles last 7-8 days with heavy bleeding 1st 4 days.  Takes a MV with iron in it.  HM: completed COVID vaccine series.  Had flu shot 10/04/2021 at [facility].  Has card with her today for me to update her record. She is due for Pap   Patient Active Problem List   Diagnosis Date Noted  . Elevated blood pressure reading 09/15/2019  . BMI 34.0-34.9,adult 09/15/2019     Current Outpatient Medications on File Prior to Visit  Medication Sig Dispense Refill  . ferrous sulfate (FERROUSUL) 325 (65 FE) MG tablet Take 1 tablet (325 mg total) by mouth 3 (three) times daily with meals. 90 tablet 3  . diclofenac sodium (VOLTAREN) 1 % GEL Apply 4 g topically 4 (four) times daily. (Patient not taking: Reported on 12/08/2020) 100 g 1  . Multiple Vitamin (MULTIVITAMIN) tablet Take 1 tablet by mouth daily. (Patient not taking: Reported on 12/08/2020)     No current facility-administered  medications on file prior to visit.    No Known Allergies  Social History   Socioeconomic History  . Marital status: Single    Spouse name: Not on file  . Number of children: Not on file  . Years of education: Not on file  . Highest education level: Not on file  Occupational History  . Not on file  Tobacco Use  . Smoking status: Never Smoker  . Smokeless tobacco: Never Used  Substance and Sexual Activity  . Alcohol use: Yes  . Drug use: Never  . Sexual activity: Not on file  Other Topics Concern  . Not on file  Social History Narrative  . Not on file   Social Determinants of Health   Financial Resource Strain: Not on file  Food Insecurity: Not on file  Transportation Needs: Not on file  Physical Activity: Not on file  Stress: Not on file  Social Connections: Not on file  Intimate Partner Violence: Not on file    Family History  Problem Relation Age of Onset  . Hyperlipidemia Mother   . Hypertension Mother   . Hyperlipidemia Father   . Diabetes Paternal Uncle     Past Surgical History:  Procedure Laterality Date  . WISDOM TOOTH EXTRACTION      ROS: Review of Systems Negative except as stated above  PHYSICAL EXAM: BP (!) 146/98   Pulse 79  Temp 98.2 F (36.8 C)   Resp 16   Ht 5\' 1"  (1.549 m)   Wt 179 lb (81.2 kg)   LMP 12/06/2020   SpO2 99%   BMI 33.82 kg/m   Wt Readings from Last 3 Encounters:  12/08/20 179 lb (81.2 kg)  09/18/19 183 lb (83 kg)  09/15/19 183 lb (83 kg)  Repeat blood pressure readings here in the office were 145/95, 145/95, 160/85  Physical Exam  General appearance - alert, well appearing, young African-American female and in no distress Mental status - normal mood, behavior, speech, dress, motor activity, and thought processes Eyes - pupils equal and reactive, extraocular eye movements intact Nose - normal and patent, no erythema, discharge or polyps Mouth - mucous membranes moist, pharynx normal without lesions Neck -  supple, no significant adenopathy Chest - clear to auscultation, no wheezes, rales or rhonchi, symmetric air entry Heart - normal rate, regular rhythm, normal S1, S2, no murmurs, rubs, clicks or gallops Extremities - peripheral pulses normal, no pedal edema, no clubbing or cyanosis   CMP Latest Ref Rng & Units 01/07/2019 11/19/2007 11/18/2007  Glucose 65 - 99 mg/dL 87 01/16/2008) 97  BUN 6 - 20 mg/dL 9 7 2(L)  Creatinine 27(P - 1.00 mg/dL 8.24 2.35 3.61  Sodium 134 - 144 mmol/L 141 139 137  Potassium 3.5 - 5.2 mmol/L 4.6 4.3 3.7 SLIGHT HEMOLYSIS  Chloride 96 - 106 mmol/L 104 110 108  CO2 20 - 29 mmol/L 23 20 22   Calcium 8.7 - 10.2 mg/dL 4.43 9.7 9.3  Total Protein 6.0 - 8.5 g/dL 7.6 - -  Total Bilirubin 0.0 - 1.2 mg/dL - -  Alkaline Phos 39 - 117 IU/L 108 - -  AST 0 - 40 IU/L 16 - -  ALT 0 - 32 IU/L 30 - -   Lipid Panel     Component Value Date/Time   CHOL 223 (H) 01/07/2019 1638   TRIG 83 01/07/2019 1638   HDL 62 01/07/2019 1638   CHOLHDL 3.6 01/07/2019 1638   LDLCALC 144 (H) 01/07/2019 1638    CBC    Component Value Date/Time   WBC 6.2 01/07/2019 1638   WBC 7.5 11/19/2007 1055   RBC 4.42 01/07/2019 1638   RBC 4.81 11/19/2007 1055   HGB 9.0 (L) 01/07/2019 1638   HCT 30.3 (L) 01/07/2019 1638   PLT 547 (H) 01/07/2019 1638   MCV 69 (L) 01/07/2019 1638   MCH 20.4 (L) 01/07/2019 1638   MCHC 29.7 (L) 01/07/2019 1638   MCHC 32.7 11/19/2007 1055   RDW 17.0 (H) 01/07/2019 1638   LYMPHSABS 2.2 01/07/2019 1638   EOSABS 0.0 01/07/2019 1638   BASOSABS 0.0 01/07/2019 1638    ASSESSMENT AND PLAN: 1. Epistaxis She will continue to use humidifier in her bedroom at nights. I recommend using Afrin nasal spray about once or twice a week in the nostrils whenever she has a nosebleed. Advised against daily use as it can cause rebound congestion and also affect blood pressure. - CBC - Ambulatory referral to ENT  2. Essential hypertension I think she does have hypertension. Discussed  diagnosis of hypertension. Discussed the importance of DASH diet which I went over with her and also gave printed materials. I recommend starting antihypertensive in the form of amlodipine. Went over possible side effects of amlodipine including lower extremity edema. - Comprehensive metabolic panel - Lipid panel - amLODipine (NORVASC) 5 MG tablet; Take 1 tablet (5 mg total) by mouth daily.  Dispense: 90 tablet; Refill: 3  3. Prediabetes Discussed healthy eating habits. Commended her on regular exercise. Advised that the goal is to get in about 150 minutes/week of moderate intensity exercise. - Hemoglobin A1c  4. Obesity (BMI 30.0-34.9) See #3 above - Amb ref to Medical Nutrition Therapy-MNT  5. Iron deficiency anemia due to chronic blood loss Check CBC today. If she is still anemic, I told her that we will recommend taking an iron supplement as there may not be sufficient iron and multivitamin tablet.   Patient was given the opportunity to ask questions.  Patient verbalized understanding of the plan and was able to repeat key elements of the plan.   Orders Placed This Encounter  Procedures  . CBC  . Comprehensive metabolic panel  . Lipid panel  . Hemoglobin A1c  . Ambulatory referral to ENT  . Amb ref to Medical Nutrition Therapy-MNT     Requested Prescriptions   Signed Prescriptions Disp Refills  . amLODipine (NORVASC) 5 MG tablet 90 tablet 3    Sig: Take 1 tablet (5 mg total) by mouth daily.    Return in about 6 weeks (around 01/19/2021) for PAP.  Jonah Blue, MD, FACP

## 2020-12-09 ENCOUNTER — Other Ambulatory Visit: Payer: Self-pay | Admitting: Internal Medicine

## 2020-12-09 DIAGNOSIS — E119 Type 2 diabetes mellitus without complications: Secondary | ICD-10-CM

## 2020-12-09 DIAGNOSIS — E1169 Type 2 diabetes mellitus with other specified complication: Secondary | ICD-10-CM | POA: Insufficient documentation

## 2020-12-09 DIAGNOSIS — E785 Hyperlipidemia, unspecified: Secondary | ICD-10-CM | POA: Insufficient documentation

## 2020-12-09 DIAGNOSIS — Z794 Long term (current) use of insulin: Secondary | ICD-10-CM

## 2020-12-09 LAB — CBC
Hematocrit: 38.7 % (ref 34.0–46.6)
Hemoglobin: 11.7 g/dL (ref 11.1–15.9)
MCH: 23.1 pg — ABNORMAL LOW (ref 26.6–33.0)
MCHC: 30.2 g/dL — ABNORMAL LOW (ref 31.5–35.7)
MCV: 76 fL — ABNORMAL LOW (ref 79–97)
Platelets: 454 10*3/uL — ABNORMAL HIGH (ref 150–450)
RBC: 5.07 x10E6/uL (ref 3.77–5.28)
RDW: 15 % (ref 11.7–15.4)
WBC: 7.4 10*3/uL (ref 3.4–10.8)

## 2020-12-09 LAB — COMPREHENSIVE METABOLIC PANEL
ALT: 23 IU/L (ref 0–32)
AST: 29 IU/L (ref 0–40)
Albumin/Globulin Ratio: 1.3 (ref 1.2–2.2)
Albumin: 4.5 g/dL (ref 3.9–5.0)
Alkaline Phosphatase: 125 IU/L — ABNORMAL HIGH (ref 44–121)
BUN/Creatinine Ratio: 16 (ref 9–23)
BUN: 13 mg/dL (ref 6–20)
Bilirubin Total: <0.2 mg/dL (ref 0.0–1.2)
CO2: 22 mmol/L (ref 20–29)
Calcium: 10.2 mg/dL (ref 8.7–10.2)
Chloride: 97 mmol/L (ref 96–106)
Creatinine, Ser: 0.8 mg/dL (ref 0.57–1.00)
GFR calc Af Amer: 114 mL/min/{1.73_m2} (ref >59)
GFR calc non Af Amer: 99 mL/min/{1.73_m2} (ref >59)
Globulin, Total: 3.4 g/dL (ref 1.5–4.5)
Glucose: 210 mg/dL — ABNORMAL HIGH (ref 65–99)
Potassium: 5 mmol/L (ref 3.5–5.2)
Sodium: 136 mmol/L (ref 134–144)
Total Protein: 7.9 g/dL (ref 6.0–8.5)

## 2020-12-09 LAB — LIPID PANEL
Chol/HDL Ratio: 4.2 ratio (ref 0.0–4.4)
Cholesterol, Total: 257 mg/dL — ABNORMAL HIGH (ref 100–199)
HDL: 61 mg/dL (ref >39)
LDL Chol Calc (NIH): 173 mg/dL — ABNORMAL HIGH (ref 0–99)
Triglycerides: 131 mg/dL (ref 0–149)
VLDL Cholesterol Cal: 23 mg/dL (ref 5–40)

## 2020-12-09 LAB — HEMOGLOBIN A1C
Est. average glucose Bld gHb Est-mCnc: 217 mg/dL
Hgb A1c MFr Bld: 9.2 % — ABNORMAL HIGH (ref 4.8–5.6)

## 2020-12-09 MED ORDER — METFORMIN HCL 500 MG PO TABS: 500.0000 mg | ORAL_TABLET | Freq: Two times a day (BID) | ORAL | 3 refills | Status: DC

## 2020-12-09 MED ORDER — ATORVASTATIN CALCIUM 10 MG PO TABS: 10.0000 mg | ORAL_TABLET | Freq: Every day | ORAL | 1 refills | Status: DC

## 2021-01-01 ENCOUNTER — Other Ambulatory Visit: Payer: Self-pay | Admitting: Internal Medicine

## 2021-02-07 ENCOUNTER — Ambulatory Visit: Payer: BC Managed Care – PPO | Admitting: Internal Medicine

## 2021-02-13 ENCOUNTER — Other Ambulatory Visit: Payer: Self-pay | Admitting: Internal Medicine

## 2021-02-13 NOTE — Telephone Encounter (Signed)
Requested Prescriptions  Pending Prescriptions Disp Refills  . atorvastatin (LIPITOR) 10 MG tablet [Pharmacy Med Name: ATORVASTATIN 10 MG TABLET] 90 tablet 2    Sig: TAKE 1 TABLET BY MOUTH EVERY DAY     Cardiovascular:  Antilipid - Statins Failed - 02/13/2021 10:31 AM      Failed - Total Cholesterol in normal range and within 360 days    Cholesterol, Total  Date Value Ref Range Status  12/08/2020 257 (H) 100 - 199 mg/dL Final         Failed - LDL in normal range and within 360 days    LDL Chol Calc (NIH)  Date Value Ref Range Status  12/08/2020 173 (H) 0 - 99 mg/dL Final         Passed - HDL in normal range and within 360 days    HDL  Date Value Ref Range Status  12/08/2020 61 >39 mg/dL Final         Passed - Triglycerides in normal range and within 360 days    Triglycerides  Date Value Ref Range Status  12/08/2020 131 0 - 149 mg/dL Final         Passed - Patient is not pregnant      Passed - Valid encounter within last 12 months    Recent Outpatient Visits          2 months ago Epistaxis   [facility] Marcine Matar, MD

## 2021-03-21 ENCOUNTER — Other Ambulatory Visit: Payer: Self-pay | Admitting: Internal Medicine

## 2021-06-16 ENCOUNTER — Other Ambulatory Visit: Payer: Self-pay | Admitting: Internal Medicine

## 2021-06-16 NOTE — Telephone Encounter (Signed)
Courtesy refill. MAde pt appt for 08/17/21

## 2021-08-17 ENCOUNTER — Ambulatory Visit: Payer: BC Managed Care – PPO | Admitting: Internal Medicine

## 2021-09-11 ENCOUNTER — Other Ambulatory Visit: Payer: Self-pay | Admitting: Internal Medicine

## 2021-09-11 NOTE — Telephone Encounter (Signed)
Requested medications are due for refill today.  yes  Requested medications are on the active medications list.  yes  Last refill. 06/16/2021  Future visit scheduled.   No - Pt cancelled appointment on 08/17/2021  Notes to clinic.Called pt to make an appointment - LMOM to return call.

## 2021-10-10 ENCOUNTER — Other Ambulatory Visit: Payer: Self-pay | Admitting: Internal Medicine

## 2021-10-11 NOTE — Telephone Encounter (Signed)
Requested medications are due for refill today.  yes  Requested medications are on the active medications list.  yes  Last refill. 06/16/2021  Future visit scheduled.   no  Notes to clinic.  Pt  last seen 12/08/2020. Cancelled last appointment. More than 3 months overdue for office visit. Courtesy refill given.Marland Kitchen

## 2021-12-13 ENCOUNTER — Other Ambulatory Visit: Payer: Self-pay | Admitting: Internal Medicine

## 2021-12-13 DIAGNOSIS — I10 Essential (primary) hypertension: Secondary | ICD-10-CM

## 2021-12-13 NOTE — Telephone Encounter (Signed)
Requested medication (s) are due for refill today: yes  Requested medication (s) are on the active medication list: yes  Last refill:  12/08/20 #90 with 3 RF  Future visit scheduled: No, canceled 01/18/21 and 08/17/21, no upcoming visit scheduled  Notes to clinic:  canceled last two visits, no upcoming visit scheduled, please assess.  Requested Prescriptions  Pending Prescriptions Disp Refills   amLODipine (NORVASC) 5 MG tablet [Pharmacy Med Name: AMLODIPINE BESYLATE 5 MG TAB] 90 tablet 3    Sig: Take 1 tablet (5 mg total) by mouth daily.     Cardiovascular: Calcium Channel Blockers 2 Failed - 12/13/2021  2:51 AM      Failed - Last BP in normal range    BP Readings from Last 1 Encounters:  12/08/20 (!) 146/98          Failed - Valid encounter within last 6 months    Recent Outpatient Visits           1 year ago Epistaxis   [facility] [place], Binnie Rail, MD              Passed - Last Heart Rate in normal range    Pulse Readings from Last 1 Encounters:  12/08/20 79

## 2022-01-21 ENCOUNTER — Other Ambulatory Visit: Payer: Self-pay | Admitting: Internal Medicine

## 2022-01-21 DIAGNOSIS — I10 Essential (primary) hypertension: Secondary | ICD-10-CM

## 2022-01-22 NOTE — Telephone Encounter (Signed)
Requested medications are due for refill today.  yes ? ?Requested medications are on the active medications list.  yes ? ?Last refill. 12/08/2020 #90 3 refills ? ?Future visit scheduled.   no ? ?Notes to clinic.  Pharmacy needs Dx code. ? ? ? ?Requested Prescriptions  ?Pending Prescriptions Disp Refills  ? amLODipine (NORVASC) 5 MG tablet [Pharmacy Med Name: AMLODIPINE BESYLATE 5 MG TAB] 90 tablet 3  ?  Sig: Take 1 tablet (5 mg total) by mouth daily.  ?  ? Cardiovascular: Calcium Channel Blockers 2 Failed - 01/21/2022  2:39 PM  ?  ?  Failed - Last BP in normal range  ?  BP Readings from Last 1 Encounters:  ?12/08/20 (!) 146/98  ?  ?  ?  ?  Failed - Valid encounter within last 6 months  ?  Recent Outpatient Visits   ? ?      ? 1 year ago Epistaxis  ? [facility] Jonah Blue B, MD  ? 2 years ago Acute pain of both knees  ? Primary Care at [facility], Binnie Rail, MD  ? 3 years ago Encounter to establish care  ? Primary Care at [facility], Godfrey Pick, FNP  ? ?  ?  ? ?  ?  ?  Passed - Last Heart Rate in normal range  ?  Pulse Readings from Last 1 Encounters:  ?12/08/20 79  ?  ?  ?  ?  ?  ?

## 2022-05-19 ENCOUNTER — Other Ambulatory Visit: Payer: Self-pay | Admitting: Internal Medicine

## 2023-09-16 ENCOUNTER — Ambulatory Visit: Payer: BC Managed Care – PPO | Attending: Internal Medicine | Admitting: Internal Medicine

## 2023-09-16 ENCOUNTER — Encounter: Payer: Self-pay | Admitting: Internal Medicine

## 2023-09-16 VITALS — BP 136/89 | HR 92 | Ht 62.0 in | Wt 175.0 lb

## 2023-09-16 DIAGNOSIS — E1159 Type 2 diabetes mellitus with other circulatory complications: Secondary | ICD-10-CM

## 2023-09-16 DIAGNOSIS — Z0000 Encounter for general adult medical examination without abnormal findings: Secondary | ICD-10-CM

## 2023-09-16 DIAGNOSIS — E785 Hyperlipidemia, unspecified: Secondary | ICD-10-CM

## 2023-09-16 DIAGNOSIS — I152 Hypertension secondary to endocrine disorders: Secondary | ICD-10-CM

## 2023-09-16 DIAGNOSIS — Z7984 Long term (current) use of oral hypoglycemic drugs: Secondary | ICD-10-CM

## 2023-09-16 DIAGNOSIS — E669 Obesity, unspecified: Secondary | ICD-10-CM | POA: Diagnosis not present

## 2023-09-16 DIAGNOSIS — E1169 Type 2 diabetes mellitus with other specified complication: Secondary | ICD-10-CM

## 2023-09-16 LAB — GLUCOSE, POCT (MANUAL RESULT ENTRY): POC Glucose: 184 mg/dL — AB (ref 70–99)

## 2023-09-16 LAB — POCT GLYCOSYLATED HEMOGLOBIN (HGB A1C): HbA1c, POC (controlled diabetic range): 7.8 % — AB (ref 0.0–7.0)

## 2023-09-16 MED ORDER — ATORVASTATIN CALCIUM 40 MG PO TABS: 40.0000 mg | ORAL_TABLET | Freq: Every day | ORAL | 2 refills | Status: DC

## 2023-09-16 MED ORDER — LOSARTAN POTASSIUM 50 MG PO TABS: 50.0000 mg | ORAL_TABLET | Freq: Every day | ORAL | 2 refills | Status: DC

## 2023-09-16 MED ORDER — RYBELSUS 14 MG PO TABS: 14.0000 mg | ORAL_TABLET | Freq: Every morning | ORAL | 2 refills | Status: DC

## 2023-09-16 MED ORDER — METFORMIN HCL ER 500 MG PO TB24: 1000.0000 mg | ORAL_TABLET | Freq: Every day | ORAL | 2 refills | Status: DC

## 2023-09-16 NOTE — Patient Instructions (Signed)
Your blood pressure is not at goal.  The goal is 130/80 or lower.  We have increased the losartan to 50 mg daily.  Make sure that you limit the salt in the foods as salt adversely affects the blood pressure.  We have changed metformin to the extended release.  It comes in 500 mg tablets.  Take 2 daily.  Preventive Care 11-33 Years Old, Female Preventive care refers to lifestyle choices and visits with your health care provider that can promote health and wellness. Preventive care visits are also called wellness exams. What can I expect for my preventive care visit? Counseling During your preventive care visit, your health care provider may ask about your: Medical history, including: Past medical problems. Family medical history. Pregnancy history. Current health, including: Menstrual cycle. Method of birth control. Emotional well-being. Home life and relationship well-being. Sexual activity and sexual health. Lifestyle, including: Alcohol, nicotine or tobacco, and drug use. Access to firearms. Diet, exercise, and sleep habits. Work and work Astronomer. Sunscreen use. Safety issues such as seatbelt and bike helmet use. Physical exam Your health care provider may check your: Height and weight. These may be used to calculate your BMI (body mass index). BMI is a measurement that tells if you are at a healthy weight. Waist circumference. This measures the distance around your waistline. This measurement also tells if you are at a healthy weight and may help predict your risk of certain diseases, such as type 2 diabetes and high blood pressure. Heart rate and blood pressure. Body temperature. Skin for abnormal spots. What immunizations do I need?  Vaccines are usually given at various ages, according to a schedule. Your health care provider will recommend vaccines for you based on your age, medical history, and lifestyle or other factors, such as travel or where you work. What tests do  I need? Screening Your health care provider may recommend screening tests for certain conditions. This may include: Pelvic exam and Pap test. Lipid and cholesterol levels. Diabetes screening. This is done by checking your blood sugar (glucose) after you have not eaten for a while (fasting). Hepatitis B test. Hepatitis C test. HIV (human immunodeficiency virus) test. STI (sexually transmitted infection) testing, if you are at risk. BRCA-related cancer screening. This may be done if you have a family history of breast, ovarian, tubal, or peritoneal cancers. Talk with your health care provider about your test results, treatment options, and if necessary, the need for more tests. Follow these instructions at home: Eating and drinking  Eat a healthy diet that includes fresh fruits and vegetables, whole grains, lean protein, and low-fat dairy products. Take vitamin and mineral supplements as recommended by your health care provider. Do not drink alcohol if: Your health care provider tells you not to drink. You are pregnant, may be pregnant, or are planning to become pregnant. If you drink alcohol: Limit how much you have to 0-1 drink a day. Know how much alcohol is in your drink. In the [place] one drink equals one 12 oz bottle of beer (355 mL), one 5 oz glass of wine (148 mL), or one 1 oz glass of hard liquor (44 mL). Lifestyle Brush your teeth every morning and night with fluoride toothpaste. Floss one time each day. Exercise for at least 30 minutes 5 or more days each week. Do not use any products that contain nicotine or tobacco. These products include cigarettes, chewing tobacco, and vaping devices, such as e-cigarettes. If you need help quitting, ask your health  care provider. Do not use drugs. If you are sexually active, practice safe sex. Use a condom or other form of protection to prevent STIs. If you do not wish to become pregnant, use a form of birth control. If you plan to become  pregnant, see your health care provider for a prepregnancy visit. Find healthy ways to manage stress, such as: Meditation, yoga, or listening to music. Journaling. Talking to a trusted person. Spending time with friends and family. Minimize exposure to UV radiation to reduce your risk of skin cancer. Safety Always wear your seat belt while driving or riding in a vehicle. Do not drive: If you have been drinking alcohol. Do not ride with someone who has been drinking. If you have been using any mind-altering substances or drugs. While texting. When you are tired or distracted. Wear a helmet and other protective equipment during sports activities. If you have firearms in your house, make sure you follow all gun safety procedures. Seek help if you have been physically or sexually abused. What's next? Go to your health care provider once a year for an annual wellness visit. Ask your health care provider how often you should have your eyes and teeth checked. Stay up to date on all vaccines. This information is not intended to replace advice given to you by your health care provider. Make sure you discuss any questions you have with your health care provider. Document Revised: 04/26/2021 Document Reviewed: 04/26/2021 Elsevier Patient Education  2024 [employer].

## 2023-09-16 NOTE — Progress Notes (Signed)
Patient ID: Lisa Stephenson, female    DOB: 22-Mar-1991  MRN: [id number]  CC: Annual Exam   Subjective: Lisa Stephenson is a 32 y.o. female who presents to re-est care and for annual exam.  Last seen 11/2020.  She was seeing Corky Mull, MD at [facility].  Last seen by her this summer Her concerns today include:  Patient with history of IDA, HL, HTN DM type 2, obesity   HM:  last pap was 04/2023 with with Dr. Arletha Grippe.  Plans to get flu shot on her own.  Reports up to date with Tdapt.  Has had HIV/hep C screen.  Due for eye exam  DM:   Results for orders placed or performed in visit on 09/16/23  POCT glycosylated hemoglobin (Hb A1C)  Result Value Ref Range   Hemoglobin A1C     HbA1c POC (<> result, manual entry)     HbA1c, POC (prediabetic range)     HbA1c, POC (controlled diabetic range) 7.8 (A) 0.0 - 7.0 %  POCT glucose (manual entry)  Result Value Ref Range   POC Glucose 184 (A) 70 - 99 mg/dl  On Rybelsus 14 mg and Metformin 1 gram daily.  Tolerating the Rybelsus but taking Metformin only 2-3x/wk due to diarrhea and bloating.  Last A1C in June was 6.6. Does not check BS but has device Started to lose wgh but got stress with grad school - on-line program and works full time at [employer].  Recently trying to get back on track. Works with a Education officer, environmental 1x/wk and work out on her own 4-5x/wk; more mindful of eating habits.   HL:  on Lipitor 40 mg daily.  Tolerating okay. HTN:  on Cozaar 25 mg daily.  Does not have device to check BP.  Limits salt in foods.    Patient Active Problem List   Diagnosis Date Noted   Controlled type 2 diabetes mellitus without complication, with long-term current use of insulin (HCC) 12/09/2020   Hyperlipidemia associated with type 2 diabetes mellitus (HCC) 12/09/2020   Elevated blood pressure reading 09/15/2019   BMI 34.0-34.9,adult 09/15/2019     Current Outpatient Medications on File Prior to Visit  Medication Sig Dispense Refill    Multiple Vitamin (MULTIVITAMIN) tablet Take 1 tablet by mouth daily. (Patient not taking: Reported on 12/08/2020)     No current facility-administered medications on file prior to visit.    Allergies  Allergen Reactions   Coconut Oil Itching    Pt stated when applied to skin, it causes itching    Social History   Socioeconomic History   Marital status: Single    Spouse name: Not on file   Number of children: Not on file   Years of education: Not on file   Highest education level: Not on file  Occupational History   Not on file  Tobacco Use   Smoking status: Never   Smokeless tobacco: Never  Substance and Sexual Activity   Alcohol use: Yes   Drug use: Never   Sexual activity: Not on file  Other Topics Concern   Not on file  Social History Narrative   Not on file   Social Determinants of Health   Financial Resource Strain: Not on file  Food Insecurity: Not on file  Transportation Needs: Not on file  Physical Activity: Not on file  Stress: Not on file  Social Connections: Not on file  Intimate Partner Violence: Not on file    Family History  Problem Relation Age of Onset   Hyperlipidemia Mother    Hypertension Mother    Hyperlipidemia Father    Diabetes Paternal Uncle     Past Surgical History:  Procedure Laterality Date   WISDOM TOOTH EXTRACTION      ROS: Review of Systems  HENT:  Negative for dental problem, hearing loss and trouble swallowing.   Eyes:        His prescription lenses for distance.  Overdue for diabetic eye exam.  Respiratory:  Negative for cough and shortness of breath.   Cardiovascular:  Negative for chest pain.  Gastrointestinal:  Negative for abdominal pain and blood in stool.  Genitourinary:  Negative for difficulty urinating and dysuria.       Menses are heavy and last 9 days.  First 4 days are heavy.  History of iron deficiency anemia.  However patient states that recent blood cell counts were improved and was told that she did not  have to take the iron anymore.  She takes a multivitamin tablet.   Negative except as stated above  PHYSICAL EXAM: BP 136/89   Pulse 92   Ht 5\' 2"  (1.575 m)   Wt 175 lb (79.4 kg)   SpO2 100%   BMI 32.01 kg/m   Wt Readings from Last 3 Encounters:  09/16/23 175 lb (79.4 kg)  12/08/20 179 lb (81.2 kg)  09/18/19 183 lb (83 kg)    Physical Exam  General appearance - alert, well appearing, and in no distress Mental status - normal mood, behavior, speech, dress, motor activity, and thought processes Eyes - pupils equal and reactive, extraocular eye movements intact Ears - bilateral TM's and external ear canals normal Nose - normal and patent, no erythema, discharge or polyps Mouth - mucous membranes moist, pharynx normal without lesions Neck - supple, no significant adenopathy Lymphatics - no palpable lymphadenopathy, no hepatosplenomegaly Chest - clear to auscultation, no wheezes, rales or rhonchi, symmetric air entry Heart - normal rate, regular rhythm, normal S1, S2, no murmurs, rubs, clicks or gallops Abdomen - soft, nontender, nondistended, no masses or organomegaly Neurological - cranial nerves II through XII intact, motor and sensory grossly normal bilaterally Musculoskeletal - no joint tenderness, deformity or swelling Extremities - peripheral pulses normal, no pedal edema, no clubbing or cyanosis Diabetic Foot Exam - Simple   Simple Foot Form Visual Inspection No deformities, no ulcerations, no other skin breakdown bilaterally: Yes Sensation Testing Intact to touch and monofilament testing bilaterally: Yes Pulse Check Posterior Tibialis and Dorsalis pulse intact bilaterally: Yes Comments        09/16/2023    3:46 PM 01/07/2019    4:40 PM  Depression screen PHQ 2/9  Decreased Interest 0 0  Down, Depressed, Hopeless 0 0  PHQ - 2 Score 0 0  Altered sleeping  0  Tired, decreased energy  0  Change in appetite  0  Feeling bad or failure about yourself   0  Trouble  concentrating  0  Moving slowly or fidgety/restless  0  Suicidal thoughts  0  PHQ-9 Score  0       Latest Ref Rng & Units 12/08/2020    3:00 PM 01/07/2019    4:38 PM 11/19/2007   10:55 AM  CMP  Glucose 65 - 99 mg/dL 161  87  63   BUN 6 - 20 mg/dL 13  9  7    Creatinine 0.57 - 1.00 mg/dL 0.96  0.45  4.09   Sodium 134 - 144 mmol/L  136  141  139   Potassium 3.5 - 5.2 mmol/L 5.0  4.6  4.3   Chloride 96 - 106 mmol/L 97  104  110   CO2 20 - 29 mmol/L 22  23  20    Calcium 8.7 - 10.2 mg/dL 13.2  44.0  9.7   Total Protein 6.0 - 8.5 g/dL 7.9  7.6    Total Bilirubin 0.0 - 1.2 mg/dL <1.0  <2.7    Alkaline Phos 44 - 121 IU/L 125  108    AST 0 - 40 IU/L 29  16    ALT 0 - 32 IU/L 23  30     Lipid Panel     Component Value Date/Time   CHOL 257 (H) 12/08/2020 1500   TRIG 131 12/08/2020 1500   HDL 61 12/08/2020 1500   CHOLHDL 4.2 12/08/2020 1500   LDLCALC 173 (H) 12/08/2020 1500    CBC    Component Value Date/Time   WBC 7.4 12/08/2020 1437   WBC 7.5 11/19/2007 1055   RBC 5.07 12/08/2020 1437   RBC 4.81 11/19/2007 1055   HGB 11.7 12/08/2020 1437   HCT 38.7 12/08/2020 1437   PLT 454 (H) 12/08/2020 1437   MCV 76 (L) 12/08/2020 1437   MCH 23.1 (L) 12/08/2020 1437   MCHC 30.2 (L) 12/08/2020 1437   MCHC 32.7 11/19/2007 1055   RDW 15.0 12/08/2020 1437   LYMPHSABS 2.2 01/07/2019 1638   EOSABS 0.0 01/07/2019 1638   BASOSABS 0.0 01/07/2019 1638    ASSESSMENT AND PLAN: 1. Annual physical exam Encouraged her to continue healthy eating habits and regular exercise.  She is meeting her goal of at least 150 minutes/week of moderate intensity exercise. She gets routine dental care.  Last dental visit was a week ago. She is due for flu vaccine.  Patient states she will get this done on her own and did not want to get it done today. -She reports being up-to-date with Pap smear and tetanus vaccine.  She will sign a release for [place] to get her records from [facility]  2. Type 2 diabetes  mellitus with obesity (HCC) Not at goal.  Tolerating Rybelsus but has some GI upset with metformin so not taking consistently.  We discussed changing metformin to extended release form to see if she tolerates it better.  Continue healthy eating habits and regular exercise. - POCT glycosylated hemoglobin (Hb A1C) - POCT glucose (manual entry) - CBC - Comprehensive metabolic panel - Microalbumin / creatinine urine ratio - Ambulatory referral to Ophthalmology - RYBELSUS 14 MG TABS; Take 1 tablet (14 mg total) by mouth every morning.  Dispense: 90 tablet; Refill: 2 - metFORMIN (GLUCOPHAGE-XR) 500 MG 24 hr tablet; Take 2 tablets (1,000 mg total) by mouth daily with breakfast.  Dispense: 180 tablet; Refill: 2  3. Hypertension associated with type 2 diabetes mellitus (HCC) Not at goal.  Increase Cozaar to 50 mg daily. - losartan (COZAAR) 50 MG tablet; Take 1 tablet (50 mg total) by mouth daily.  Dispense: 90 tablet; Refill: 2  4. Hyperlipidemia associated with type 2 diabetes mellitus (HCC) Continue atorvastatin. - Lipid panel - atorvastatin (LIPITOR) 40 MG tablet; Take 1 tablet (40 mg total) by mouth daily.  Dispense: 90 tablet; Refill: 2    Patient was given the opportunity to ask questions.  Patient verbalized understanding of the plan and was able to repeat key elements of the plan.   This documentation was completed using Paediatric nurse.  Any transcriptional errors are unintentional.  Orders Placed This Encounter  Procedures   CBC   Comprehensive metabolic panel   Lipid panel   Microalbumin / creatinine urine ratio   Ambulatory referral to Ophthalmology   POCT glycosylated hemoglobin (Hb A1C)   POCT glucose (manual entry)     Requested Prescriptions   Signed Prescriptions Disp Refills   RYBELSUS 14 MG TABS 90 tablet 2    Sig: Take 1 tablet (14 mg total) by mouth every morning.   metFORMIN (GLUCOPHAGE-XR) 500 MG 24 hr tablet 180 tablet 2    Sig: Take 2  tablets (1,000 mg total) by mouth daily with breakfast.   losartan (COZAAR) 50 MG tablet 90 tablet 2    Sig: Take 1 tablet (50 mg total) by mouth daily.   atorvastatin (LIPITOR) 40 MG tablet 90 tablet 2    Sig: Take 1 tablet (40 mg total) by mouth daily.    Return in about 4 months (around 01/14/2024) for Sign release to get Med records from [facility].  Jonah Blue, MD, FACP

## 2023-09-17 LAB — MICROALBUMIN / CREATININE URINE RATIO
Creatinine, Urine: 29.1 mg/dL
Microalb/Creat Ratio: 36 mg/g{creat} — ABNORMAL HIGH (ref 0–29)
Microalbumin, Urine: 10.4 ug/mL

## 2023-09-17 LAB — COMPREHENSIVE METABOLIC PANEL
ALT: 41 [IU]/L — ABNORMAL HIGH (ref 0–32)
AST: 23 [IU]/L (ref 0–40)
Albumin: 4.4 g/dL (ref 3.9–4.9)
Alkaline Phosphatase: 132 [IU]/L — ABNORMAL HIGH (ref 44–121)
BUN/Creatinine Ratio: 13 (ref 9–23)
BUN: 10 mg/dL (ref 6–20)
Bilirubin Total: 0.2 mg/dL (ref 0.0–1.2)
CO2: 22 mmol/L (ref 20–29)
Calcium: 10 mg/dL (ref 8.7–10.2)
Chloride: 101 mmol/L (ref 96–106)
Creatinine, Ser: 0.77 mg/dL (ref 0.57–1.00)
Globulin, Total: 3.1 g/dL (ref 1.5–4.5)
Glucose: 182 mg/dL — ABNORMAL HIGH (ref 70–99)
Potassium: 4.9 mmol/L (ref 3.5–5.2)
Sodium: 137 mmol/L (ref 134–144)
Total Protein: 7.5 g/dL (ref 6.0–8.5)
eGFR: 104 mL/min/{1.73_m2} (ref >59)

## 2023-09-17 LAB — CBC
Hematocrit: 35.6 % (ref 34.0–46.6)
Hemoglobin: 10.7 g/dL — ABNORMAL LOW (ref 11.1–15.9)
MCH: 21.4 pg — ABNORMAL LOW (ref 26.6–33.0)
MCHC: 30.1 g/dL — ABNORMAL LOW (ref 31.5–35.7)
MCV: 71 fL — ABNORMAL LOW (ref 79–97)
Platelets: 562 10*3/uL — ABNORMAL HIGH (ref 150–450)
RBC: 4.99 x10E6/uL (ref 3.77–5.28)
RDW: 16.6 % — ABNORMAL HIGH (ref 11.7–15.4)
WBC: 8.1 10*3/uL (ref 3.4–10.8)

## 2023-09-17 LAB — LIPID PANEL
Chol/HDL Ratio: 3.7 ratio (ref 0.0–4.4)
Cholesterol, Total: 239 mg/dL — ABNORMAL HIGH (ref 100–199)
HDL: 65 mg/dL (ref >39)
LDL Chol Calc (NIH): 145 mg/dL — ABNORMAL HIGH (ref 0–99)
Triglycerides: 163 mg/dL — ABNORMAL HIGH (ref 0–149)
VLDL Cholesterol Cal: 29 mg/dL (ref 5–40)

## 2023-12-05 ENCOUNTER — Telehealth: Payer: Self-pay

## 2023-12-05 NOTE — Telephone Encounter (Signed)
Copied from CRM [id number]. Topic: Clinical - Medical Advice >> Dec 05, 2023  2:54 PM Maxwell Marion wrote: Reason for CRM: Patient wants to know if she can be switched to [facility], feels the RYBELSUS 14 MG isn't working

## 2023-12-05 NOTE — Telephone Encounter (Signed)
Can discuss her request to change to [facility] on her next visit.

## 2023-12-06 NOTE — Telephone Encounter (Signed)
Patient aware of message per Dr. Laural Benes. Reminded of appointment, March 6 at 1610. Patient verbalized understanding.

## 2023-12-31 LAB — HM DIABETES EYE EXAM

## 2024-01-01 ENCOUNTER — Encounter: Payer: Self-pay | Admitting: Internal Medicine

## 2024-01-16 ENCOUNTER — Encounter: Payer: Self-pay | Admitting: Internal Medicine

## 2024-01-16 ENCOUNTER — Ambulatory Visit: Payer: BC Managed Care – PPO | Attending: Internal Medicine | Admitting: Internal Medicine

## 2024-01-16 VITALS — BP 103/68 | HR 90 | Temp 97.9°F | Ht 62.0 in | Wt 171.0 lb

## 2024-01-16 DIAGNOSIS — Z7984 Long term (current) use of oral hypoglycemic drugs: Secondary | ICD-10-CM | POA: Diagnosis not present

## 2024-01-16 DIAGNOSIS — Z7985 Long-term (current) use of injectable non-insulin antidiabetic drugs: Secondary | ICD-10-CM

## 2024-01-16 DIAGNOSIS — E119 Type 2 diabetes mellitus without complications: Secondary | ICD-10-CM | POA: Diagnosis not present

## 2024-01-16 DIAGNOSIS — E669 Obesity, unspecified: Secondary | ICD-10-CM | POA: Diagnosis not present

## 2024-01-16 DIAGNOSIS — I10 Essential (primary) hypertension: Secondary | ICD-10-CM | POA: Diagnosis not present

## 2024-01-16 DIAGNOSIS — E785 Hyperlipidemia, unspecified: Secondary | ICD-10-CM

## 2024-01-16 DIAGNOSIS — Z6831 Body mass index (BMI) 31.0-31.9, adult: Secondary | ICD-10-CM

## 2024-01-16 DIAGNOSIS — E1159 Type 2 diabetes mellitus with other circulatory complications: Secondary | ICD-10-CM

## 2024-01-16 DIAGNOSIS — D509 Iron deficiency anemia, unspecified: Secondary | ICD-10-CM

## 2024-01-16 LAB — POCT GLYCOSYLATED HEMOGLOBIN (HGB A1C): HbA1c, POC (controlled diabetic range): 8.2 % — AB (ref 0.0–7.0)

## 2024-01-16 LAB — GLUCOSE, POCT (MANUAL RESULT ENTRY): POC Glucose: 97 mg/dL (ref 70–99)

## 2024-01-16 MED ORDER — ONETOUCH VERIO W/DEVICE KIT: PACK | 0 refills | Status: AC

## 2024-01-16 MED ORDER — ONETOUCH VERIO VI STRP: ORAL_STRIP | 12 refills | Status: AC

## 2024-01-16 MED ORDER — TIRZEPATIDE 2.5 MG/0.5ML ~~LOC~~ SOAJ: 2.5000 mg | SUBCUTANEOUS | 0 refills | Status: DC

## 2024-01-16 MED ORDER — EMPAGLIFLOZIN 10 MG PO TABS: 10.0000 mg | ORAL_TABLET | Freq: Every day | ORAL | 4 refills | Status: DC

## 2024-01-16 NOTE — Patient Instructions (Addendum)
 Stop metformin. Start Jardiance 10 mg instead.  This will cause more frequent urination. Let me know if you experience any frequent vaginal yeast.  Start Mounjaro 2.5 mg once a week.  After being on this dose for 1 month, send me a Mychart message to let me know you are tolerating it so that we can increase the dose to 5 mg. Stop Rybelsus once you get the [facility].  Purchase Iron supplement/Ferrous Sulfate OTC 325 mg and take one daily.

## 2024-01-16 NOTE — Progress Notes (Signed)
 Patient ID: Lisa Stephenson, female    DOB: [birth year]  MRN: [id number]  CC: Diabetes (DM f/u. Franchot Erichsen Greggory Keen /No to all vax)   Subjective: Lisa Stephenson is a 33 y.o. female who presents for chronic ds management. Her concerns today include:  Patient with history of IDA, HL, HTN DM type 2, obesity   Discussed the use of AI scribe software for clinical note transcription with the patient, who gave verbal consent to proceed.  History of Present Illness   DM: Results for orders placed or performed in visit on 01/16/24  POCT glycosylated hemoglobin (Hb A1C)   Collection Time: 01/16/24  5:00 PM  Result Value Ref Range   Hemoglobin A1C     HbA1c POC (<> result, manual entry)     HbA1c, POC (prediabetic range)     HbA1c, POC (controlled diabetic range) 8.2 (A) 0.0 - 7.0 %  POCT glucose (manual entry)   Collection Time: 01/16/24  5:00 PM  Result Value Ref Range   POC Glucose 97 70 - 99 mg/dl  She is currently taking Rybelsus 14mg  daily and Metformin XR 500mg  2 tabs daily at breakfast.  Despite changing to the metformin XR, she continues to have diarrhea 3-4 times a day.  Despite these challenges, the patient reports feeling better overall and has been making efforts to improve her diet and exercise habits. She has been meal prepping and avoiding sugary drinks, and she exercises for about 45-50 minutes three times a week.  Down 4 pounds since last visit.  Request referral to nutritionist for dietary counseling. -Not checking blood sugars.  Requesting new diabetic testing supplies including glucometer.  Would have liked a continuous glucose monitor but even with insurance it is costly.  IDA: She has been taking an iron supplement that she purchases on [employer].  She shows me a picture of the supplement that is in gummy form.  She is not sure of the dose.    HL: Taking and tolerating atorvastatin.  LDL on last visit was around 140.  Since then she has been taking the atorvastatin  consistently.  In regards to her blood pressure, she is taking the Cozaar 50 mg daily as prescribed.   HM: Reports having had Pap smear last year through [facility].  She declines Tdap and pneumonia vaccine today.  May consider on future visit. Patient Active Problem List   Diagnosis Date Noted   Controlled type 2 diabetes mellitus without complication, with long-term current use of insulin (HCC) 12/09/2020   Hyperlipidemia associated with type 2 diabetes mellitus (HCC) 12/09/2020   Elevated blood pressure reading 09/15/2019   BMI 34.0-34.9,adult 09/15/2019     Current Outpatient Medications on File Prior to Visit  Medication Sig Dispense Refill   atorvastatin (LIPITOR) 40 MG tablet Take 1 tablet (40 mg total) by mouth daily. 90 tablet 2   Ferrous Sulfate 27 MG TABS Take 16 mg by mouth daily. Take 1 tablet daily     losartan (COZAAR) 50 MG tablet Take 1 tablet (50 mg total) by mouth daily. 90 tablet 2   metFORMIN (GLUCOPHAGE-XR) 500 MG 24 hr tablet Take 2 tablets (1,000 mg total) by mouth daily with breakfast. 180 tablet 2   Multiple Vitamin (MULTIVITAMIN) tablet Take 1 tablet by mouth daily.     No current facility-administered medications on file prior to visit.    Allergies  Allergen Reactions   Coconut Oil Itching    Pt stated when applied to skin, it causes itching  Social History   Socioeconomic History   Marital status: Single    Spouse name: Not on file   Number of children: Not on file   Years of education: Not on file   Highest education level: Bachelor's degree (e.g., BA, AB, BS)  Occupational History   Not on file  Tobacco Use   Smoking status: Never   Smokeless tobacco: Never  Substance and Sexual Activity   Alcohol use: Yes   Drug use: Never   Sexual activity: Not on file  Other Topics Concern   Not on file  Social History Narrative   Not on file   Social Drivers of Health   Financial Resource Strain: Low Risk  (01/15/2024)   Overall Financial Resource  Strain (CARDIA)    Difficulty of Paying Living Expenses: Not very hard  Food Insecurity: No Food Insecurity (01/15/2024)   Hunger Vital Sign    Worried About Running Out of Food in the Last Year: Never true    Ran Out of Food in the Last Year: Never true  Transportation Needs: No Transportation Needs (01/15/2024)   PRAPARE - Administrator, Civil Service (Medical): No    Lack of Transportation (Non-Medical): No  Physical Activity: Insufficiently Active (01/15/2024)   Exercise Vital Sign    Days of Exercise per Week: 3 days    Minutes of Exercise per Session: 40 min  Stress: No Stress Concern Present (01/15/2024)   [employer] of Occupational Health - Occupational Stress Questionnaire    Feeling of Stress : Only a little  Social Connections: Moderately Integrated (01/15/2024)   Social Connection and Isolation Panel [NHANES]    Frequency of Communication with Friends and Family: Three times a week    Frequency of Social Gatherings with Friends and Family: Twice a week    Attends Religious Services: More than 4 times per year    Active Member of [employer] or Organizations: Yes    Attends Engineer, structural: More than 4 times per year    Marital Status: Never married  Catering manager Violence: Not on file    Family History  Problem Relation Age of Onset   Hyperlipidemia Mother    Hypertension Mother    Hyperlipidemia Father    Diabetes Paternal Uncle     Past Surgical History:  Procedure Laterality Date   WISDOM TOOTH EXTRACTION      ROS: Review of Systems Negative except as stated above  PHYSICAL EXAM: BP 103/68 (BP Location: Left Arm, Patient Position: Sitting, Cuff Size: Normal)   Pulse 90   Temp 97.9 F (36.6 C) (Oral)   Ht 5\' 2"  (1.575 m)   Wt 171 lb (77.6 kg)   SpO2 100%   BMI 31.28 kg/m   Wt Readings from Last 3 Encounters:  01/16/24 171 lb (77.6 kg)  09/16/23 175 lb (79.4 kg)  12/08/20 179 lb (81.2 kg)    Physical Exam  General  appearance - alert, well appearing, young to middle-age African-American female and in no distress Mental status - normal mood, behavior, speech, dress, motor activity, and thought processes Neck - supple, no significant adenopathy Chest - clear to auscultation, no wheezes, rales or rhonchi, symmetric air entry Heart - normal rate, regular rhythm, normal S1, S2, no murmurs, rubs, clicks or gallops Extremities - peripheral pulses normal, no pedal edema, no clubbing or cyanosis Diabetic Foot Exam - Simple   Simple Foot Form Diabetic Foot exam was performed with the following findings: Yes 01/16/2024  5:55 PM  Visual Inspection No deformities, no ulcerations, no other skin breakdown bilaterally: Yes Sensation Testing Intact to touch and monofilament testing bilaterally: Yes Pulse Check Posterior Tibialis and Dorsalis pulse intact bilaterally: Yes Comments         Latest Ref Rng & Units 09/16/2023    3:57 PM 12/08/2020    3:00 PM 01/07/2019    4:38 PM  CMP  Glucose 70 - 99 mg/dL 409  811  87   BUN 6 - 20 mg/dL 10  13  9    Creatinine 0.57 - 1.00 mg/dL 9.14  7.82  9.56   Sodium 134 - 144 mmol/L 137  136  141   Potassium 3.5 - 5.2 mmol/L 4.9  5.0  4.6   Chloride 96 - 106 mmol/L 101  97  104   CO2 20 - 29 mmol/L 22  22  23    Calcium 8.7 - 10.2 mg/dL 21.3  08.6  57.8   Total Protein 6.0 - 8.5 g/dL 7.5  7.9  7.6   Total Bilirubin 0.0 - 1.2 mg/dL 0.2  <4.6  <9.6   Alkaline Phos 44 - 121 IU/L 132  125  108   AST 0 - 40 IU/L 23  29  16    ALT 0 - 32 IU/L 41  23  30    Lipid Panel     Component Value Date/Time   CHOL 239 (H) 09/16/2023 1557   TRIG 163 (H) 09/16/2023 1557   HDL 65 09/16/2023 1557   CHOLHDL 3.7 09/16/2023 1557   LDLCALC 145 (H) 09/16/2023 1557    CBC    Component Value Date/Time   WBC 8.1 09/16/2023 1557   WBC 7.5 11/19/2007 1055   RBC 4.99 09/16/2023 1557   RBC 4.81 11/19/2007 1055   HGB 10.7 (L) 09/16/2023 1557   HCT 35.6 09/16/2023 1557   PLT 562 (H)  09/16/2023 1557   MCV 71 (L) 09/16/2023 1557   MCH 21.4 (L) 09/16/2023 1557   MCHC 30.1 (L) 09/16/2023 1557   MCHC 32.7 11/19/2007 1055   RDW 16.6 (H) 09/16/2023 1557   LYMPHSABS 2.2 01/07/2019 1638   EOSABS 0.0 01/07/2019 1638   BASOSABS 0.0 01/07/2019 1638    ASSESSMENT AND PLAN: 1. Type 2 diabetes mellitus with obesity (HCC) (Primary) -Stop metformin since she is still having significant diarrhea. We discussed starting Jardiance instead 10 mg daily.  Advised patient that the Jardiance will cause frequent urination.  Advised to hold the medication if ever she is experiencing any acute gastrointestinal illness with vomiting/diarrhea until it resolves. -We discussed changing Rybelsus to [facility] to see if we can get better weight loss and A1c to goal.  I went over with her how the medication works and possible side effects.  Advised that the medication can cause nausea, vomiting, pancreatitis, bowel blockage, diarrhea/constipation.  If she develops any vomiting, abdominal pain or severe diarrhea/constipation, she should stop the medication and come in to be seen.  Advised that the [facility] is a once weekly injection.  Once filled at the pharmacy, she should request the pharmacist to show her how to do the once weekly injections.  Once she has the [facility], she should stop the Rybelsus.  Encouraged her to continue healthy eating habits and regular exercise -Once being on the Mounjaro 2.5 mg once a week for 4 weeks, she can send me a MyChart message letting me know so that we can increase the dose to 5 mg as long as she is  tolerating it without significant side effects.  She expresses understanding. - POCT glycosylated hemoglobin (Hb A1C) - POCT glucose (manual entry) - empagliflozin (JARDIANCE) 10 MG TABS tablet; Take 1 tablet (10 mg total) by mouth daily before breakfast.  Dispense: 30 tablet; Refill: 4 - Amb ref to Medical Nutrition Therapy-MNT - tirzepatide (MOUNJARO) 2.5 MG/0.5ML Pen; Inject  2.5 mg into the skin once a week.  Dispense: 2 mL; Refill: 0  2. Hypertension associated with type 2 diabetes mellitus (HCC) At goal.  Continue Cozaar 50 mg daily  3. Hyperlipidemia associated with type 2 diabetes mellitus (HCC) LDL was not at goal on last visit 4 months ago.  Reports that she is taking the atorvastatin consistently now.  She will come back to the lab in a week or 2 to have cholesterol level rechecked.  4. Iron deficiency anemia, unspecified iron deficiency anemia type Advised to purchase ferrous sulfate 325 mg over-the-counter and take 1 daily.  The pitcher of the iron Gummies that she is purchasing from [place] does not show me the dose of the iron that is in it.  Patient was given the opportunity to ask questions.  Patient verbalized understanding of the plan and was able to repeat key elements of the plan.   This documentation was completed using Paediatric nurse.  Any transcriptional errors are unintentional.  Orders Placed This Encounter  Procedures   Amb ref to Medical Nutrition Therapy-MNT   POCT glycosylated hemoglobin (Hb A1C)   POCT glucose (manual entry)     Requested Prescriptions   Signed Prescriptions Disp Refills   empagliflozin (JARDIANCE) 10 MG TABS tablet 30 tablet 4    Sig: Take 1 tablet (10 mg total) by mouth daily before breakfast.   tirzepatide (MOUNJARO) 2.5 MG/0.5ML Pen 2 mL 0    Sig: Inject 2.5 mg into the skin once a week.   Blood Glucose Monitoring Suppl (ONETOUCH VERIO) w/Device KIT 1 kit 0    Sig: UAD to check BS twice a day   glucose blood (ONETOUCH VERIO) test strip 100 each 12    Sig: Use as instructed to check BS twice a day before meals.    Return in about 3 months (around 04/17/2024).  Jonah Blue, MD, FACP

## 2024-02-06 ENCOUNTER — Encounter: Payer: Self-pay | Admitting: Internal Medicine

## 2024-02-13 ENCOUNTER — Other Ambulatory Visit: Payer: Self-pay | Admitting: Family Medicine

## 2024-02-13 DIAGNOSIS — E1169 Type 2 diabetes mellitus with other specified complication: Secondary | ICD-10-CM

## 2024-02-13 MED ORDER — TIRZEPATIDE 2.5 MG/0.5ML ~~LOC~~ SOAJ: 2.5000 mg | SUBCUTANEOUS | 1 refills | Status: DC

## 2024-03-05 ENCOUNTER — Encounter: Payer: Self-pay | Admitting: Dietician

## 2024-03-05 ENCOUNTER — Encounter: Attending: Internal Medicine | Admitting: Dietician

## 2024-03-05 VITALS — Wt 169.0 lb

## 2024-03-05 DIAGNOSIS — E119 Type 2 diabetes mellitus without complications: Secondary | ICD-10-CM | POA: Diagnosis present

## 2024-03-05 NOTE — Patient Instructions (Signed)
 Goals Established by Patient:   Goal 1: Add 1-2 30 minute walks weekly following a meal.   Goal 2: incorporate non-starchy vegetables with 2 meals per day.   Goal 3: drink 75 oz water daily.

## 2024-03-05 NOTE — Progress Notes (Signed)
 Diabetes Self-Management Education  Visit Type: First/Initial  Appt. Start Time: 1450 Appt. End Time: 1740  03/05/2024  [name], identified by name and date of birth, is a 33 y.o. female with a diagnosis of Diabetes: Type 2.   ASSESSMENT  History includes: type 2 diabetes Labs noted: 01/16/24: A1c 8.2% Medications include: jardiance , metformin , mounjaro Supplements: ferrous sulfate , MVI, vitamin d3,   Pt reports she saw a dietitian in the past a few years ago but she wanted to reassess information about diabetes and come up with strategies for eating habits.   Pt states she has been working on a calorie deficit using My Fitness Pal and reports consuming around 700 calories daily. Pt reports she was 173 lb and is 169 lb today. Pt states she eats mostly from home and eats out on Saturday.   Pt reports she works out 3 days a week, one day with a Psychologist, educational. Pt reports consistent exercise became a part of her routine at the beginning of this year.   Pt states she finds it hard to know how to grocery shop.   Weight 169 lb (76.7 kg). Body mass index is 30.91 kg/m.   Diabetes Self-Management Education - 03/05/24 1444       Visit Information   Visit Type First/Initial      Initial Visit   Diabetes Type Type 2    Date Diagnosed 2022    Are you currently following a meal plan? No    Are you taking your medications as prescribed? Yes      Health Coping   How would you rate your overall health? Fair      Psychosocial Assessment   Patient Belief/Attitude about Diabetes Defeat/Burnout    What is the hardest part about your diabetes right now, causing you the most concern, or is the most worrisome to you about your diabetes?   Making healty food and beverage choices    Self-care barriers None    Self-management support Doctor's office    Other persons present Patient    Patient Concerns Nutrition/Meal planning    Special Needs None    Preferred Learning Style No  preference indicated    Learning Readiness Ready    How often do you need to have someone help you when you read instructions, pamphlets, or other written materials from your doctor or pharmacy? 1 - Never    What is the last grade level you completed in school? college      Pre-Education Assessment   Patient understands the diabetes disease and treatment process. Needs Instruction    Patient understands incorporating nutritional management into lifestyle. Needs Instruction    Patient undertands incorporating physical activity into lifestyle. Needs Instruction    Patient understands using medications safely. Needs Instruction    Patient understands monitoring blood glucose, interpreting and using results Needs Instruction    Patient understands prevention, detection, and treatment of acute complications. Needs Instruction    Patient understands prevention, detection, and treatment of chronic complications. Needs Instruction    Patient understands how to develop strategies to address psychosocial issues. Needs Instruction    Patient understands how to develop strategies to promote health/change behavior. Needs Instruction      Complications   Last HgB A1C per patient/outside source 8.2 %    How often do you check your blood sugar? 1-2 times/day    Fasting Blood glucose range (mg/dL) 161-096    Have you had a dilated eye exam in the past  12 months? Yes    Have you had a dental exam in the past 12 months? Yes    Are you checking your feet? No      Dietary Intake   Breakfast applesauce and 100 cal nut pack    Snack (morning) none    Lunch tuna packet and pretzel thins and carrots    Snack (afternoon) skinny pop    Dinner chicken salad OR chicken and vegetable soup    Snack (evening) none    Beverage(s) coffee with SF creamer, 64 oz water, la croix      Activity / Exercise   Activity / Exercise Type Light (walking / raking leaves)    How many days per week do you exercise? 3    How many  minutes per day do you exercise? 30    Total minutes per week of exercise 90      Patient Education   Previous Diabetes Education Yes (please comment)    Disease Pathophysiology Definition of diabetes, type 1 and 2, and the diagnosis of diabetes;Explored patient's options for treatment of their diabetes;Factors that contribute to the development of diabetes    Healthy Eating Role of diet in the treatment of diabetes and the relationship between the three main macronutrients and blood glucose level;Plate Method;Information on hints to eating out and maintain blood glucose control.;Meal options for control of blood glucose level and chronic complications.;Reviewed blood glucose goals for pre and post meals and how to evaluate the patients' food intake on their blood glucose level.    Being Active Role of exercise on diabetes management, blood pressure control and cardiac health.;Helped patient identify appropriate exercises in relation to his/her diabetes, diabetes complications and other health issue.    Medications Reviewed patients medication for diabetes, action, purpose, timing of dose and side effects.    Monitoring Identified appropriate SMBG and/or A1C goals.    Chronic complications Relationship between chronic complications and blood glucose control;Identified and discussed with patient  current chronic complications    Diabetes Stress and Support Identified and addressed patients feelings and concerns about diabetes;Worked with patient to identify barriers to care and solutions;Role of stress on diabetes    Lifestyle and Health Coping Lifestyle issues that need to be addressed for better diabetes care      Individualized Goals (developed by patient)   Nutrition General guidelines for healthy choices and portions discussed    Physical Activity Exercise 3-5 times per week;30 minutes per day    Medications take my medication as prescribed    Monitoring  Test my blood glucose as discussed     Problem Solving Eating Pattern    Reducing Risk examine blood glucose patterns;do foot checks daily;treat hypoglycemia with 15 grams of carbs if blood glucose less than 70mg /dL    Health Coping Ask for help with psychological, social, or emotional issues      Post-Education Assessment   Patient understands the diabetes disease and treatment process. Comprehends key points    Patient understands incorporating nutritional management into lifestyle. Comprehends key points    Patient undertands incorporating physical activity into lifestyle. Comprehends key points    Patient understands using medications safely. Comphrehends key points    Patient understands monitoring blood glucose, interpreting and using results Comprehends key points    Patient understands prevention, detection, and treatment of acute complications. Comprehends key points    Patient understands prevention, detection, and treatment of chronic complications. Comprehends key points    Patient understands how to  develop strategies to address psychosocial issues. Comprehends key points    Patient understands how to develop strategies to promote health/change behavior. Comprehends key points      Outcomes   Expected Outcomes Demonstrated interest in learning. Expect positive outcomes    Future DMSE 2 months    Program Status Not Completed             Individualized Plan for Diabetes Self-Management Training:   Learning Objective:  Patient will have a greater understanding of diabetes self-management. Patient education plan is to attend individual and/or group sessions per assessed needs and concerns.   Plan:   Patient Instructions  Goals Established by Patient:   Goal 1: Add 1-2 30 minute walks weekly following a meal.   Goal 2: incorporate non-starchy vegetables with 2 meals per day.   Goal 3: drink 75 oz water daily.   Expected Outcomes:  Demonstrated interest in learning. Expect positive outcomes  Education  material provided: ADA - How to Thrive: A Guide for Your Journey with Diabetes and My Plate, Grocery List  If problems or questions, patient to contact team via:  Phone  Future DSME appointment: 2 months

## 2024-03-13 ENCOUNTER — Other Ambulatory Visit: Payer: Self-pay | Admitting: Internal Medicine

## 2024-03-13 MED ORDER — TIRZEPATIDE 5 MG/0.5ML ~~LOC~~ SOAJ: 5.0000 mg | SUBCUTANEOUS | 0 refills | Status: DC

## 2024-03-17 ENCOUNTER — Other Ambulatory Visit: Payer: Self-pay

## 2024-03-23 ENCOUNTER — Telehealth: Payer: Self-pay

## 2024-03-23 NOTE — Telephone Encounter (Signed)
 Pharmacy Patient Advocate Encounter   Received notification from CoverMyMeds that prior authorization for MOUNJARO is required/requested.   Insurance verification completed.   The patient is insured through [facility] .   Per test claim: PA required; PA submitted to above mentioned insurance via CoverMyMeds Key/confirmation #/EOC [id number] Status is pending

## 2024-04-13 ENCOUNTER — Other Ambulatory Visit: Payer: Self-pay | Admitting: Internal Medicine

## 2024-04-14 ENCOUNTER — Other Ambulatory Visit: Payer: Self-pay | Admitting: Internal Medicine

## 2024-04-14 MED ORDER — TIRZEPATIDE 7.5 MG/0.5ML ~~LOC~~ SOAJ: 7.5000 mg | SUBCUTANEOUS | 1 refills | Status: DC

## 2024-04-14 NOTE — Telephone Encounter (Signed)
 Copied from CRM [id number]. Topic: Clinical - Medication Refill >> Apr 14, 2024 11:22 AM [name] wrote: Medication: tirzepatide (MOUNJARO) 5 MG/0.5ML Pen AND losartan  (COZAAR ) 50 MG tablet  Has the patient contacted their pharmacy? Yes, received notification from pharmacy to contact the office.   This is the patient's preferred pharmacy:  [id number] [place], [place] - [address] AT [address] [place] [address] Phone: [phone] Fax: [phone]  Is this the correct pharmacy for this prescription? Yes  Has the prescription been filled recently? Yes  Is the patient out of the medication? No  Has the patient been seen for an appointment in the last year OR does the patient have an upcoming appointment? Yes  Can we respond through MyChart? Yes  Agent: Please be advised that Rx refills may take up to 3 business days. We ask that you follow-up with your pharmacy.

## 2024-04-14 NOTE — Telephone Encounter (Signed)
 Requested medication (s) are due for refill today: yes  Requested medication (s) are on the active medication list: yes  Last refill:  03/13/24 2 ml  Future visit scheduled: yes  Notes to clinic:  med not assigned to a protocol   Requested Prescriptions  Pending Prescriptions Disp Refills   MOUNJARO 5 MG/0.5ML Pen [Pharmacy Med Name: MOUNJARO 5 MG/0.5 ML PEN]  1    Sig: INJECT 5 MG SUBCUTANEOUSLY WEEKLY     Off-Protocol Failed - 04/14/2024 11:40 AM      Failed - Medication not assigned to a protocol, review manually.      Passed - Valid encounter within last 12 months    Recent Outpatient Visits           2 months ago Type 2 diabetes mellitus with obesity (HCC)   [facility] [place] Lawrance Presume, MD   7 months ago Annual physical exam   [facility] [place]. [facility] Lawrance Presume, MD   3 years ago Epistaxis   [facility] [place]. [facility] Law[place] Presume, MD   4 years ago Acute pain of both knees   [facility], Rexine Cater, MD   5 years ago Encounter to establish care   [facility], Alyse July, [place]

## 2024-04-21 ENCOUNTER — Ambulatory Visit: Admitting: Internal Medicine

## 2024-04-23 ENCOUNTER — Other Ambulatory Visit: Payer: Self-pay | Admitting: Internal Medicine

## 2024-04-24 NOTE — Telephone Encounter (Signed)
 Too soon for refill, duplicate request.  Requested Prescriptions  Pending Prescriptions Disp Refills   MOUNJARO  5 MG/0.5ML Pen [Pharmacy Med Name: MOUNJARO  5 MG/0.5 ML PEN]      Sig: INJECT 5 MG SUBCUTANEOUSLY WEEKLY     Off-Protocol Failed - 04/24/2024  9:32 AM      Failed - Medication not assigned to a protocol, review manually.      Passed - Valid encounter within last 12 months    Recent Outpatient Visits           3 months ago Type 2 diabetes mellitus with obesity (HCC)   [facility] [place]. [facility] [place] Presume, MD   7 months ago Annual physical exam   [facility] [place]. [facility] Law[place] Presume, MD   3 years ago Epistaxis   [facility] [place]. [facility] Law[place] Presume, MD   4 years ago Acute pain of both knees   [facility] [facility], Rexine Cater, MD   5 years ago Encounter to establish care   [facility], Alyse July, [place]

## 2024-05-07 ENCOUNTER — Encounter: Attending: Internal Medicine | Admitting: Dietician

## 2024-05-07 ENCOUNTER — Encounter: Payer: Self-pay | Admitting: Dietician

## 2024-05-07 VITALS — Wt 166.3 lb

## 2024-05-07 DIAGNOSIS — E119 Type 2 diabetes mellitus without complications: Secondary | ICD-10-CM | POA: Diagnosis present

## 2024-05-07 NOTE — Patient Instructions (Addendum)
 New Goals:   Goal: Add a non-starchy vegetable with lunch.   Goal: 64 oz water daily.   Goal: get a home workout or walk when you can't get to the gym.

## 2024-05-07 NOTE — Progress Notes (Signed)
 Diabetes Self-Management Education  Visit Type: Follow-up  Appt. Start Time: 1400 Appt. End Time: 1430  05/07/2024  [name], identified by name and date of birth, is a 33 y.o. female with a diagnosis of Diabetes: type 2  .   ASSESSMENT  History includes: type 2 diabetes Labs noted: 01/16/24: A1c 8.2% Medications include: jardiance , mounjaro  Supplements: ferrous sulfate , MVI, vitamin d3,   Wt Readings from Last 3 Encounters:  05/07/24 166 lb 4.8 oz (75.4 kg)  03/05/24 169 lb (76.7 kg)  01/16/24 171 lb (77.6 kg)   Pt reports she has been working toward making changes including incorporating some walking after lunch about 2 times per week, and increasing her water intake. Pt states she has found more consistency with grocery shopping and has been utilizing the 5, 4, 3, 2, 1 method.   Pt states she continues working with a trainer once a week, and works out in total about 3-4 times per week. Pt reports she goes with her coworker and does a variety of weights, machines, and circuits.   Pt states she stopped metformin  and feels much better.   Pt reports she has been doing some meal prepping and bought difference containers.   Assessment of Previous Goals Established by Patient:    Goal 1: Add 1-2 30 minute walks weekly following a meal. - goal met, continue!   Goal 2: incorporate non-starchy vegetables with 2 meals per day. - goal in progress, work to add one to lunch.    Goal 3: drink 75 oz water daily. - goal not met, changed to 64 oz.   Weight 166 lb 4.8 oz (75.4 kg). Body mass index is 30.42 kg/m.   Diabetes Self-Management Education - 05/07/24 1456       Visit Information   Visit Type Follow-up      Health Coping   How would you rate your overall health? Good      Psychosocial Assessment   Patient Belief/Attitude about Diabetes Motivated to manage diabetes    What is the hardest part about your diabetes right now, causing you the most concern, or is the  most worrisome to you about your diabetes?   Making healty food and beverage choices    Self-care barriers None    Self-management support Doctor's office    Other persons present Patient    Patient Concerns Nutrition/Meal planning    Special Needs None    Preferred Learning Style No preference indicated    Learning Readiness Ready      Pre-Education Assessment   Patient understands the diabetes disease and treatment process. Needs Review    Patient understands incorporating nutritional management into lifestyle. Needs Review    Patient undertands incorporating physical activity into lifestyle. Needs Review    Patient understands using medications safely. Needs Review    Patient understands monitoring blood glucose, interpreting and using results Needs Review    Patient understands prevention, detection, and treatment of acute complications. Needs Review    Patient understands prevention, detection, and treatment of chronic complications. Needs Review    Patient understands how to develop strategies to address psychosocial issues. Needs Review    Patient understands how to develop strategies to promote health/change behavior. Needs Review      Complications   Last HgB A1C per patient/outside source 8.2 %    How often do you check your blood sugar? 0 times/day (not testing)      Dietary Intake   Breakfast kind bar, fruit cup,  and rice cake    Snack (morning) none    Lunch pretzels, tuna, fruit cup, oikos triple zero yogurt    Snack (afternoon) hummus and pretzels    Dinner brown rice, broccoli, chicken    Snack (evening) none    Beverage(s) coffee, 60 oz water      Activity / Exercise   Activity / Exercise Type Moderate (swimming / aerobic walking)    How many days per week do you exercise? 4    How many minutes per day do you exercise? 30    Total minutes per week of exercise 120      Patient Education   Previous Diabetes Education Yes (please comment)    Disease  Pathophysiology Explored patient's options for treatment of their diabetes    Healthy Eating Role of diet in the treatment of diabetes and the relationship between the three main macronutrients and blood glucose level;Plate Method;Meal timing in regards to the patients' current diabetes medication.;Meal options for control of blood glucose level and chronic complications.;Information on hints to eating out and maintain blood glucose control.    Being Active Role of exercise on diabetes management, blood pressure control and cardiac health.;Helped patient identify appropriate exercises in relation to his/her diabetes, diabetes complications and other health issue.    Medications Reviewed patients medication for diabetes, action, purpose, timing of dose and side effects.    Chronic complications Relationship between chronic complications and blood glucose control;Identified and discussed with patient  current chronic complications    Diabetes Stress and Support Identified and addressed patients feelings and concerns about diabetes    Lifestyle and Health Coping Lifestyle issues that need to be addressed for better diabetes care      Individualized Goals (developed by patient)   Nutrition General guidelines for healthy choices and portions discussed    Physical Activity Exercise 5-7 days per week;30 minutes per day    Medications take my medication as prescribed    Monitoring  Test my blood glucose as discussed    Problem Solving Eating Pattern    Reducing Risk examine blood glucose patterns;do foot checks daily;treat hypoglycemia with 15 grams of carbs if blood glucose less than 70mg /dL    Health Coping Ask for help with psychological, social, or emotional issues      Patient Self-Evaluation of Goals - Patient rates self as meeting previously set goals (% of time)   Nutrition >75% (most of the time)    Physical Activity >75% (most of the time)    Medications >75% (most of the time)    Monitoring  25 - 50% (sometimes)    Problem Solving and behavior change strategies  50 - 75 % (half of the time)    Reducing Risk (treating acute and chronic complications) 50 - 75 % (half of the time)    Health Coping 50 - 75 % (half of the time)      Post-Education Assessment   Patient understands the diabetes disease and treatment process. Comprehends key points    Patient understands incorporating nutritional management into lifestyle. Comprehends key points    Patient undertands incorporating physical activity into lifestyle. Comprehends key points    Patient understands using medications safely. Comphrehends key points    Patient understands monitoring blood glucose, interpreting and using results Comprehends key points    Patient understands prevention, detection, and treatment of acute complications. Comprehends key points    Patient understands prevention, detection, and treatment of chronic complications. Comprehends key points  Patient understands how to develop strategies to address psychosocial issues. Comprehends key points    Patient understands how to develop strategies to promote health/change behavior. Comprehends key points      Outcomes   Expected Outcomes Demonstrated interest in learning. Expect positive outcomes    Future DMSE 3-4 months    Program Status Not Completed      Subsequent Visit   Since your last visit have you continued or begun to take your medications as prescribed? Yes          Individualized Plan for Diabetes Self-Management Training:   Learning Objective:  Patient will have a greater understanding of diabetes self-management. Patient education plan is to attend individual and/or group sessions per assessed needs and concerns.   Plan:   Patient Instructions  New Goals:   Goal: Add a non-starchy vegetable with lunch.   Goal: 64 oz water daily.   Goal: get a home workout or walk when you can't get to the gym.    Expected Outcomes:  Demonstrated  interest in learning. Expect positive outcomes  Education material provided: none at this follow up  If problems or questions, patient to contact team via:  Phone  Future DSME appointment: 3-4 months

## 2024-06-02 ENCOUNTER — Telehealth: Payer: Self-pay | Admitting: Internal Medicine

## 2024-06-02 NOTE — Telephone Encounter (Signed)
 Called pt to confirm appt for 7/22

## 2024-06-04 ENCOUNTER — Encounter: Payer: Self-pay | Admitting: Internal Medicine

## 2024-06-04 ENCOUNTER — Ambulatory Visit: Attending: Internal Medicine | Admitting: Internal Medicine

## 2024-06-04 VITALS — BP 128/80 | HR 77 | Temp 97.8°F | Ht 62.0 in | Wt 164.0 lb

## 2024-06-04 DIAGNOSIS — I10 Essential (primary) hypertension: Secondary | ICD-10-CM | POA: Diagnosis not present

## 2024-06-04 DIAGNOSIS — Z6830 Body mass index (BMI) 30.0-30.9, adult: Secondary | ICD-10-CM

## 2024-06-04 DIAGNOSIS — E669 Obesity, unspecified: Secondary | ICD-10-CM

## 2024-06-04 DIAGNOSIS — Z7985 Long-term (current) use of injectable non-insulin antidiabetic drugs: Secondary | ICD-10-CM | POA: Diagnosis not present

## 2024-06-04 DIAGNOSIS — E785 Hyperlipidemia, unspecified: Secondary | ICD-10-CM

## 2024-06-04 DIAGNOSIS — Z114 Encounter for screening for human immunodeficiency virus [HIV]: Secondary | ICD-10-CM

## 2024-06-04 DIAGNOSIS — Z7984 Long term (current) use of oral hypoglycemic drugs: Secondary | ICD-10-CM

## 2024-06-04 DIAGNOSIS — D509 Iron deficiency anemia, unspecified: Secondary | ICD-10-CM

## 2024-06-04 DIAGNOSIS — Z1159 Encounter for screening for other viral diseases: Secondary | ICD-10-CM

## 2024-06-04 DIAGNOSIS — E119 Type 2 diabetes mellitus without complications: Secondary | ICD-10-CM

## 2024-06-04 DIAGNOSIS — E1159 Type 2 diabetes mellitus with other circulatory complications: Secondary | ICD-10-CM

## 2024-06-04 DIAGNOSIS — E1169 Type 2 diabetes mellitus with other specified complication: Secondary | ICD-10-CM

## 2024-06-04 LAB — POCT GLYCOSYLATED HEMOGLOBIN (HGB A1C): HbA1c, POC (controlled diabetic range): 5.7 % (ref 0.0–7.0)

## 2024-06-04 LAB — GLUCOSE, POCT (MANUAL RESULT ENTRY): POC Glucose: 88 mg/dL (ref 70–99)

## 2024-06-04 MED ORDER — TIRZEPATIDE 10 MG/0.5ML ~~LOC~~ SOAJ: 10.0000 mg | SUBCUTANEOUS | 1 refills | Status: AC

## 2024-06-04 MED ORDER — ATORVASTATIN CALCIUM 40 MG PO TABS: 40.0000 mg | ORAL_TABLET | Freq: Every day | ORAL | 2 refills | Status: AC

## 2024-06-04 MED ORDER — LOSARTAN POTASSIUM 50 MG PO TABS: 50.0000 mg | ORAL_TABLET | Freq: Every day | ORAL | 2 refills | Status: AC

## 2024-06-04 NOTE — Patient Instructions (Signed)
 VISIT SUMMARY:  Lisa Stephenson, a 33 year old female, visited for a follow-up on her chronic conditions including diabetes, hypertension, hyperlipidemia, and anemia. She has shown significant improvement in managing her diabetes with a new medication regimen and lifestyle changes. Her blood pressure and cholesterol levels are being monitored, and she is maintaining her current treatments. She also discussed issues with her blood glucose monitoring device and general health maintenance needs.  YOUR PLAN:  -TYPE 2 DIABETES MELLITUS: Type 2 diabetes is a condition where your body does not use insulin properly, leading to high blood sugar levels. Your A1c has improved significantly from 8.2% to 5.7% with the new medications, Mounjaro  and Jardiance . We will increase your Mounjaro  dose to 10 mg after four weeks on 7.5 mg and then discontinue Jardiance . Please schedule an appointment with the clinical pharmacist to troubleshoot your blood glucose monitoring device.  -HYPERTENSION: Hypertension is high blood pressure, which can lead to serious health issues if not managed. Your blood pressure today was 122/82 mmHg, slightly above the target. Continue taking Cozaar  50 mg daily and limit your salt intake. We will recheck your blood pressure at your next visit.  -HYPERLIPIDEMIA: Hyperlipidemia is having high levels of fats (lipids) in your blood, which can increase the risk of heart disease. You are currently taking atorvastatin  40 mg daily. We will recheck your cholesterol levels soon.  -ANEMIA: Anemia is a condition where you do not have enough healthy red blood cells to carry adequate oxygen to your body's tissues. You are currently taking a multivitamin instead of an iron supplement and have no symptoms. We will recheck your blood count and iron levels.  -GENERAL HEALTH MAINTENANCE: General health maintenance includes routine check-ups and screenings to prevent health issues. You are due for a Pap smear, and  we need to obtain your records from [place] for your last Pap smear and HPV vaccination status. If records are not available, we will perform a Pap smear. We also recommend screening for hepatitis C and HIV.  INSTRUCTIONS:  Please schedule an appointment with the clinical pharmacist to troubleshoot your blood glucose monitoring device. We will recheck your blood pressure, cholesterol levels, blood count, and iron levels at your next visit. Additionally, we need to obtain your records from [place] for your last Pap smear and HPV vaccination status. If records are not available, we will perform a Pap smear. We also recommend screening for hepatitis C and HIV.

## 2024-06-04 NOTE — Progress Notes (Signed)
 Patient ID: Lisa Stephenson, female    DOB: Jul 01, 1991  MRN: [id number]  CC: Diabetes (DM f/u. Med refill. /No questions / concerns/Will bring imm rec from [facility]. Yes to pap. )   Subjective: Lisa Stephenson is a 33 y.o. female who presents for chronic ds management. Her concerns today include:  Patient with history of IDA, HL, HTN DM type 2, obesity   Discussed the use of AI scribe software for clinical note transcription with the patient, who gave verbal consent to proceed.  History of Present Illness Lisa Stephenson is a 33 year old female with diabetes, hypertension, hyperlipidemia, and anemia who presents for follow-up of her chronic medical conditions.  DM: Results for orders placed or performed in visit on 06/04/24  POCT glucose (manual entry)   Collection Time: 06/04/24 10:45 AM  Result Value Ref Range   POC Glucose 88 70 - 99 mg/dl  POCT glycosylated hemoglobin (Hb A1C)   Collection Time: 06/04/24 10:53 AM  Result Value Ref Range   Hemoglobin A1C     HbA1c POC (<> result, manual entry)     HbA1c, POC (prediabetic range)     HbA1c, POC (controlled diabetic range) 5.7 0.0 - 7.0 %  Four months ago, her diabetes medication regimen was adjusted by discontinuing Rybelsus  and metformin  due to gastrointestinal side effects from the latter and initiating Mounjaro  and Jardiance  10 mg instead. She has improved tolerance with Mounjaro , noting it is easier to manage as it is a weekly injection. She reports a decrease in appetite and has noticed some weight loss since the last visit. She is currently on Mounjaro  7.5 mg weekly and Jardiance  10 mg daily, both of which she tolerates well. Some itching at the injection site for a few minutes after injection but no redness or swelling. She is aware that her A1c was previously 8.2% and is now 5.7% based on recent lab results. -She engages in regular physical activity, attending the gym three to four times a week for 30 to 40 minutes per  session. She has consulted with a dietitian and is now eating smaller portions, meal prepping, and incorporating more vegetables into her diet.  Hypertension, she continues to take Cozaar  50 mg daily. She did not take her morning dose today. She limits her salt intake and has no chest pain, shortness of breath, or leg swelling.  HL: she is on atorvastatin  40 mg daily and reports consistent use.  IDA: In terms of anemia, she previously purchased iron supplements but is currently taking a multivitamin daily instead. No fatigue or dizziness.  HM: She is agreeable to being screened for hepatitis C and HIV.  She reports having had her Pap smear done about a year ago through [facility] gynecology.  She had reported the same on last visit but we have not received a report.  Patient states that she will stop by their office today to get a copy of it for us .  She may need HPV and Tdap vaccine but states that she will check her immunization records at home to see if she has had these already.     Patient Active Problem List   Diagnosis Date Noted   Controlled type 2 diabetes mellitus without complication, with long-term current use of insulin (HCC) 12/09/2020   Hyperlipidemia associated with type 2 diabetes mellitus (HCC) 12/09/2020   Elevated blood pressure reading 09/15/2019   BMI 34.0-34.9,adult 09/15/2019     Current Outpatient Medications on File Prior to Visit  Medication  Sig Dispense Refill   Blood Glucose Monitoring Suppl (ONETOUCH VERIO) w/Device KIT UAD to check BS twice a day 1 kit 0   empagliflozin  (JARDIANCE ) 10 MG TABS tablet Take 1 tablet (10 mg total) by mouth daily before breakfast. 30 tablet 4   Ferrous Sulfate  27 MG TABS Take 16 mg by mouth daily. Take 1 tablet daily     glucose blood (ONETOUCH VERIO) test strip Use as instructed to check BS twice a day before meals. 100 each 12   Multiple Vitamin (MULTIVITAMIN) tablet Take 1 tablet by mouth daily.     tirzepatide  (MOUNJARO ) 7.5  MG/0.5ML Pen Inject 7.5 mg into the skin once a week. 2 mL 1   No current facility-administered medications on file prior to visit.    Allergies  Allergen Reactions   Coconut Oil Itching    Pt stated when applied to skin, it causes itching   Metformin  And Related Diarrhea    Social History   Socioeconomic History   Marital status: Single    Spouse name: Not on file   Number of children: Not on file   Years of education: Not on file   Highest education level: Bachelor's degree (e.g., BA, AB, BS)  Occupational History   Not on file  Tobacco Use   Smoking status: Never   Smokeless tobacco: Never  Substance and Sexual Activity   Alcohol use: Yes   Drug use: Never   Sexual activity: Not on file  Other Topics Concern   Not on file  Social History Narrative   Not on file   Social Drivers of Health   Financial Resource Strain: Low Risk  (06/02/2024)   Overall Financial Resource Strain (CARDIA)    Difficulty of Paying Living Expenses: Not very hard  Food Insecurity: No Food Insecurity (06/02/2024)   Hunger Vital Sign    Worried About Running Out of Food in the Last Year: Never true    Ran Out of Food in the Last Year: Never true  Transportation Needs: No Transportation Needs (06/02/2024)   PRAPARE - Administrator, Civil Service (Medical): No    Lack of Transportation (Non-Medical): No  Physical Activity: Insufficiently Active (06/02/2024)   Exercise Vital Sign    Days of Exercise per Week: 3 days    Minutes of Exercise per Session: 30 min  Stress: No Stress Concern Present (06/02/2024)   [employer] of Occupational Health - Occupational Stress Questionnaire    Feeling of Stress: Only a little  Social Connections: Moderately Integrated (06/02/2024)   Social Connection and Isolation Panel    Frequency of Communication with Friends and Family: More than three times a week    Frequency of Social Gatherings with Friends and Family: Once a week    Attends  Religious Services: More than 4 times per year    Active Member of [employer] or Organizations: Yes    Attends Banker Meetings: 1 to 4 times per year    Marital Status: Never married  Catering manager Violence: Not on file    Family History  Problem Relation Age of Onset   Hyperlipidemia Mother    Hypertension Mother    Hyperlipidemia Father    Diabetes Paternal Uncle     Past Surgical History:  Procedure Laterality Date   WISDOM TOOTH EXTRACTION      ROS: Review of Systems Negative except as stated above  PHYSICAL EXAM: BP 128/80   Pulse 77   Temp 97.8 F (36.6  C) (Oral)   Ht 5' 2 (1.575 m)   Wt 164 lb (74.4 kg)   SpO2 100%   BMI 30.00 kg/m   Wt Readings from Last 3 Encounters:  06/04/24 164 lb (74.4 kg)  05/07/24 166 lb 4.8 oz (75.4 kg)  03/05/24 169 lb (76.7 kg)    Physical Exam  General appearance - alert, well appearing, young appearing AAF and in no distress Mental status - normal mood, behavior, speech, dress, motor activity, and thought processes Eyes - slightly pale conjunctiva Chest - clear to auscultation, no wheezes, rales or rhonchi, symmetric air entry Heart - normal rate, regular rhythm, normal S1, S2, no murmurs, rubs, clicks or gallops Extremities - peripheral pulses normal, no pedal edema, no clubbing or cyanosis      Latest Ref Rng & Units 09/16/2023    3:57 PM 12/08/2020    3:00 PM 01/07/2019    4:38 PM  CMP  Glucose 70 - 99 mg/dL 817  789  87   BUN 6 - 20 mg/dL 10  13  9    Creatinine 0.57 - 1.00 mg/dL 9.22  9.19  9.18   Sodium 134 - 144 mmol/L 137  136  141   Potassium 3.5 - 5.2 mmol/L 4.9  5.0  4.6   Chloride 96 - 106 mmol/L 101  97  104   CO2 20 - 29 mmol/L 22  22  23    Calcium  8.7 - 10.2 mg/dL 89.9  89.7  89.9   Total Protein 6.0 - 8.5 g/dL 7.5  7.9  7.6   Total Bilirubin 0.0 - 1.2 mg/dL 0.2  <9.7  <9.7   Alkaline Phos 44 - 121 IU/L 132  125  108   AST 0 - 40 IU/L 23  29  16    ALT 0 - 32 IU/L 41  23  30    Lipid  Panel     Component Value Date/Time   CHOL 239 (H) 09/16/2023 1557   TRIG 163 (H) 09/16/2023 1557   HDL 65 09/16/2023 1557   CHOLHDL 3.7 09/16/2023 1557   LDLCALC 145 (H) 09/16/2023 1557    CBC    Component Value Date/Time   WBC 8.1 09/16/2023 1557   WBC 7.5 11/19/2007 1055   RBC 4.99 09/16/2023 1557   RBC 4.81 11/19/2007 1055   HGB 10.7 (L) 09/16/2023 1557   HCT 35.6 09/16/2023 1557   PLT 562 (H) 09/16/2023 1557   MCV 71 (L) 09/16/2023 1557   MCH 21.4 (L) 09/16/2023 1557   MCHC 30.1 (L) 09/16/2023 1557   MCHC 32.7 11/19/2007 1055   RDW 16.6 (H) 09/16/2023 1557   LYMPHSABS 2.2 01/07/2019 1638   EOSABS 0.0 01/07/2019 1638   BASOSABS 0.0 01/07/2019 1638    ASSESSMENT AND PLAN: 1. Type 2 diabetes mellitus with obesity (HCC) (Primary) At goal with A1c responding well to Mounjaro  and Jardiance . She has achieved some weight loss so far.  She is currently under 7.5 mg of Mounjaro  and have a few shots left at this dose.  When she is completing the 7.5 mg, we will increase to the 10 mg to hopefully bring about greater weight loss.  Once she starts the 10 mg, she should stop the Jardiance . Continue healthy eating habits and regular exercise. - POCT glycosylated hemoglobin (Hb A1C) - POCT glucose (manual entry) - CBC - tirzepatide  (MOUNJARO ) 10 MG/0.5ML Pen; Inject 10 mg into the skin once a week.  Dispense: 6 mL; Refill: 1  2. Long-term (current)  use of injectable non-insulin antidiabetic drugs 3. Diabetes mellitus treated with oral medication (HCC) See # 1 above  4. Hypertension associated with type 2 diabetes mellitus (HCC) At goal. Continue Cozaar  - losartan  (COZAAR ) 50 MG tablet; Take 1 tablet (50 mg total) by mouth daily.  Dispense: 90 tablet; Refill: 2  5. Hyperlipidemia associated with type 2 diabetes mellitus (HCC) - atorvastatin  (LIPITOR) 40 MG tablet; Take 1 tablet (40 mg total) by mouth daily.  Dispense: 90 tablet; Refill: 2 - Lipid panel  6. Iron deficiency  anemia, unspecified iron deficiency anemia type Currently taking a multivitamin.  She is agreeable to having blood cell count and iron studies rechecked today. - Iron, TIBC and Ferritin Panel  7. Need for hepatitis C screening test - Hepatitis C Antibody  8. Screening for HIV (human immunodeficiency virus) - HIV antibody (with reflex)  Pt to get copy of her last pap from her gyn.  Patient was given the opportunity to ask questions.  Patient verbalized understanding of the plan and was able to repeat key elements of the plan.   This documentation was completed using Paediatric nurse.  Any transcriptional errors are unintentional.  Orders Placed This Encounter  Procedures   CBC   Iron, TIBC and Ferritin Panel   Lipid panel   HIV antibody (with reflex)   Hepatitis C Antibody   POCT glycosylated hemoglobin (Hb A1C)   POCT glucose (manual entry)     Requested Prescriptions   Signed Prescriptions Disp Refills   losartan  (COZAAR ) 50 MG tablet 90 tablet 2    Sig: Take 1 tablet (50 mg total) by mouth daily.   atorvastatin  (LIPITOR) 40 MG tablet 90 tablet 2    Sig: Take 1 tablet (40 mg total) by mouth daily.   tirzepatide  (MOUNJARO ) 10 MG/0.5ML Pen 6 mL 1    Sig: Inject 10 mg into the skin once a week.    Return in about 4 months (around 10/05/2024).  Barnie Louder, MD, FACP

## 2024-06-05 ENCOUNTER — Ambulatory Visit: Payer: Self-pay | Admitting: Internal Medicine

## 2024-06-05 LAB — IRON,TIBC AND FERRITIN PANEL
Ferritin: 8 ng/mL — ABNORMAL LOW (ref 15–150)
Iron Saturation: 4 % — CL (ref 15–55)
Iron: 16 ug/dL — ABNORMAL LOW (ref 27–159)
Total Iron Binding Capacity: 438 ug/dL (ref 250–450)
UIBC: 422 ug/dL (ref 131–425)

## 2024-06-05 LAB — LIPID PANEL
Chol/HDL Ratio: 2.8 ratio (ref 0.0–4.4)
Cholesterol, Total: 160 mg/dL (ref 100–199)
HDL: 58 mg/dL (ref >39)
LDL Chol Calc (NIH): 90 mg/dL (ref 0–99)
Triglycerides: 62 mg/dL (ref 0–149)
VLDL Cholesterol Cal: 12 mg/dL (ref 5–40)

## 2024-06-05 LAB — HEPATITIS C ANTIBODY: Hep C Virus Ab: NONREACTIVE

## 2024-06-05 LAB — CBC
Hematocrit: 38.1 % (ref 34.0–46.6)
Hemoglobin: 10.6 g/dL — ABNORMAL LOW (ref 11.1–15.9)
MCH: 20.7 pg — ABNORMAL LOW (ref 26.6–33.0)
MCHC: 27.8 g/dL — ABNORMAL LOW (ref 31.5–35.7)
MCV: 74 fL — ABNORMAL LOW (ref 79–97)
Platelets: 440 x10E3/uL (ref 150–450)
RBC: 5.12 x10E6/uL (ref 3.77–5.28)
RDW: 18.6 % — ABNORMAL HIGH (ref 11.7–15.4)
WBC: 4.8 x10E3/uL (ref 3.4–10.8)

## 2024-06-05 LAB — HIV ANTIBODY (ROUTINE TESTING W REFLEX): HIV Screen 4th Generation wRfx: NONREACTIVE

## 2024-06-20 ENCOUNTER — Other Ambulatory Visit: Payer: Self-pay | Admitting: Internal Medicine

## 2024-06-20 DIAGNOSIS — E669 Obesity, unspecified: Secondary | ICD-10-CM

## 2024-07-14 ENCOUNTER — Telehealth: Payer: Self-pay | Admitting: Internal Medicine

## 2024-07-14 DIAGNOSIS — M25561 Pain in right knee: Secondary | ICD-10-CM

## 2024-07-14 NOTE — Telephone Encounter (Signed)
 Copied from CRM [id number]. Topic: Referral - Request for Referral >> Jul 14, 2024  9:02 AM Tiffini S wrote:  Did the patient discuss referral with their provider in the last year? No (If No - schedule appointment) (If Yes - send message)  Appointment offered? Yes  Type of order/referral and detailed reason for visit: Right knee pain/ had a tele doc appointment on 07/13/24   Preference of office, provider, location:   Maude Herald MD [facility] [address], [place], [place] [address] [phone]  If referral order, have you been seen by this specialty before? Yes in 2020 for the left knee (If Yes, this issue or another issue? When? Where? Unsure  Can we respond through MyChart? Yes and please call at [phone]  >> Jul 14, 2024  9:23 AM Viola F wrote: Patient last call got disconnected, she called back to update the referral - she would like the referral sent to Rosalynn Camie CROME, MD since she's seen her in the past. Please let patient know via MyChart when referral is in. She would like to schedule as soon as possible.

## 2024-07-15 NOTE — Addendum Note (Signed)
 Addended by: VICCI SOBER B on: 07/15/2024 09:21 AM   Modules accepted: Orders

## 2024-07-15 NOTE — Telephone Encounter (Signed)
 Ortho referral submitted

## 2024-07-21 ENCOUNTER — Encounter: Payer: Self-pay | Admitting: Internal Medicine

## 2024-08-04 ENCOUNTER — Ambulatory Visit: Admitting: Dietician

## 2024-10-06 ENCOUNTER — Encounter: Payer: Self-pay | Admitting: Internal Medicine

## 2024-10-06 ENCOUNTER — Ambulatory Visit: Attending: Internal Medicine | Admitting: Internal Medicine

## 2024-10-06 VITALS — BP 117/77 | HR 85 | Temp 98.0°F | Ht 62.0 in | Wt 160.0 lb

## 2024-10-06 DIAGNOSIS — E119 Type 2 diabetes mellitus without complications: Secondary | ICD-10-CM

## 2024-10-06 DIAGNOSIS — Z7985 Long-term (current) use of injectable non-insulin antidiabetic drugs: Secondary | ICD-10-CM | POA: Diagnosis not present

## 2024-10-06 DIAGNOSIS — E1159 Type 2 diabetes mellitus with other circulatory complications: Secondary | ICD-10-CM | POA: Diagnosis not present

## 2024-10-06 DIAGNOSIS — Z7984 Long term (current) use of oral hypoglycemic drugs: Secondary | ICD-10-CM | POA: Diagnosis not present

## 2024-10-06 DIAGNOSIS — I152 Hypertension secondary to endocrine disorders: Secondary | ICD-10-CM

## 2024-10-06 DIAGNOSIS — E1169 Type 2 diabetes mellitus with other specified complication: Secondary | ICD-10-CM | POA: Diagnosis not present

## 2024-10-06 DIAGNOSIS — E785 Hyperlipidemia, unspecified: Secondary | ICD-10-CM

## 2024-10-06 DIAGNOSIS — Z23 Encounter for immunization: Secondary | ICD-10-CM | POA: Diagnosis not present

## 2024-10-06 LAB — POCT GLYCOSYLATED HEMOGLOBIN (HGB A1C): HbA1c, POC (controlled diabetic range): 5.8 % (ref 0.0–7.0)

## 2024-10-06 LAB — GLUCOSE, POCT (MANUAL RESULT ENTRY): POC Glucose: 94 mg/dL (ref 70–99)

## 2024-10-06 MED ORDER — EMPAGLIFLOZIN 10 MG PO TABS: 10.0000 mg | ORAL_TABLET | Freq: Every day | ORAL | 1 refills | Status: AC

## 2024-10-06 MED ORDER — TIRZEPATIDE 12.5 MG/0.5ML ~~LOC~~ SOAJ: 12.5000 mg | SUBCUTANEOUS | 1 refills | Status: AC

## 2024-10-06 NOTE — Patient Instructions (Signed)
  VISIT SUMMARY: Today, we reviewed your chronic conditions, including diabetes, hypertension, and hyperlipidemia. Your diabetes is well-controlled with an A1c of 5.8% and a blood sugar level of 94 mg/dL. Your blood pressure is also well-managed at 117/77 mmHg, and your LDL cholesterol has improved to 90 mg/dL. We discussed your recent right knee injury, which is improving, and your current exercise routine. Additionally, you received a TDAP vaccine, and we are awaiting your Pap smear results.  YOUR PLAN: -TYPE 2 DIABETES MELLITUS: Your diabetes is well-controlled with an A1c of 5.8% and a blood sugar level of 94 mg/dL. You will increase your Mounjaro  dose to 12.5 mg weekly after finishing your current dose and continue taking Jardiance  10 mg daily. Keep monitoring your blood sugar regularly and continue with your dietary modifications, including reducing salt and increasing fruits and vegetables. Using a fitness app to track your diet and exercise is encouraged.  -HYPERTENSION: Your blood pressure is well-controlled at 117/77 mmHg with your current medication, losartan  50 mg daily. Continue taking this medication and maintain a low-salt diet.  -HYPERLIPIDEMIA: Your LDL cholesterol has improved to 90 mg/dL with your current medication, atorvastatin  40 mg daily. The goal is to get it below 70 mg/dL. Continue taking atorvastatin  as prescribed.  -RIGHT KNEE INJURY, IMPROVING: Your right knee injury is improving, and you have resumed your exercise routine. Continue using the compression sleeve during exercise and maintain your regular exercise regimen with your trainer three times a week.  -GENERAL HEALTH MAINTENANCE: You received a TDAP vaccine today. We are also awaiting the results of your recent Pap smear. Keep up with your regular health check-ups and screenings.  INSTRUCTIONS: Please follow up for your next appointment in 3 months to review your chronic conditions and any new lab results. Continue  monitoring your blood sugar and blood pressure regularly. If you experience any new symptoms or have concerns, contact the office.                      Contains text generated by Abridge.                                 Contains text generated by Abridge.

## 2024-10-06 NOTE — Progress Notes (Signed)
 Patient ID: Lisa Stephenson, female    DOB: 1991/09/04  MRN: [id number]  CC: Diabetes (DM f/u. Med refill./No questions/ concerns /Tdap vax administered on 10/06/2024 - C.A.)   Subjective: Lisa Stephenson is a 33 y.o. female who presents for chronic ds management. Her concerns today include:  Patient with history of IDA, HL, HTN DM type 2, obesity   Discussed the use of AI scribe software for clinical note transcription with the patient, who gave verbal consent to proceed.  History of Present Illness   Lisa Stephenson is a 33 year old female with diabetes, hypertension, and hyperlipidemia who presents for chronic disease management follow-up.  DM:  Results for orders placed or performed in visit on 10/06/24  POCT glycosylated hemoglobin (Hb A1C)   Collection Time: 10/06/24  3:48 PM  Result Value Ref Range   Hemoglobin A1C     HbA1c POC (<> result, manual entry)     HbA1c, POC (prediabetic range)     HbA1c, POC (controlled diabetic range) 5.8 0.0 - 7.0 %  POCT glucose (manual entry)   Collection Time: 10/06/24  3:49 PM  Result Value Ref Range   POC Glucose 94 70 - 99 mg/dl  She manages her diabetes with Mounjaro , increased to 10 mg a month ago, and Jardiance  10 mg daily. She feels better on Jardiance  and tolerates Mounjaro  without nausea, vomiting, or abdominal pain. She mentioned a previous issue with injection site irritation from Mounjaro , which resolved after changing the injection site. Blood sugars are checked monthly due to needing a new glucose meter. Her A1c is 5.8, and today's blood sugar is 94. She has lost 4 pounds since July, now weighing 160 pounds, and notes decreased appetite with Mounjaro .  She has been cooking more at home, meal prepping, incorporating more fruits and vegetables, and using My[employer]itnessPal to track her diet. She has significantly reduced salt intake but finds intermittent fasting challenging due to her job and holiday events.  She resumed working out  about two and a half weeks ago after a right knee injury from hiking, which caused swelling. She was out of a brace and physical therapy by early October and now works out three times a week, including once with a psychologist, educational. She wears a compression sleeve for support during workouts.  HTN: Her blood pressure today was 117/77, and she continues to take Cozaar  50 mg daily. No chest pain or shortness of breath.   HL: Her LDL cholesterol was previously 145 and is now 90. She is on atorvastatin  40 mg daily and tolerates it well.  HM: She is due for a tetanus vaccine and is awaiting Pap smear results from her gyn. They did send us  records but no pap results enclosed.  Patient Active Problem List   Diagnosis Date Noted   Controlled type 2 diabetes mellitus without complication, with long-term current use of insulin (HCC) 12/09/2020   Hyperlipidemia associated with type 2 diabetes mellitus (HCC) 12/09/2020   Elevated blood pressure reading 09/15/2019   BMI 34.0-34.9,adult 09/15/2019     Current Outpatient Medications on File Prior to Visit  Medication Sig Dispense Refill   atorvastatin  (LIPITOR) 40 MG tablet Take 1 tablet (40 mg total) by mouth daily. 90 tablet 2   Blood Glucose Monitoring Suppl (ONETOUCH VERIO) w/Device KIT UAD to check BS twice a day 1 kit 0   Ferrous Sulfate  27 MG TABS Take 16 mg by mouth daily. Take 1 tablet daily     glucose blood (ONETOUCH  VERIO) test strip Use as instructed to check BS twice a day before meals. 100 each 12   losartan  (COZAAR ) 50 MG tablet Take 1 tablet (50 mg total) by mouth daily. 90 tablet 2   Multiple Vitamin (MULTIVITAMIN) tablet Take 1 tablet by mouth daily.     tirzepatide  (MOUNJARO ) 10 MG/0.5ML Pen Inject 10 mg into the skin once a week. 6 mL 1   No current facility-administered medications on file prior to visit.    Allergies  Allergen Reactions   Coconut Oil Itching    Pt stated when applied to skin, it causes itching   Metformin  And Related  Diarrhea    Social History   Socioeconomic History   Marital status: Single    Spouse name: Not on file   Number of children: Not on file   Years of education: Not on file   Highest education level: Bachelor's degree (e.g., BA, AB, BS)  Occupational History   Not on file  Tobacco Use   Smoking status: Never   Smokeless tobacco: Never  Substance and Sexual Activity   Alcohol use: Yes   Drug use: Never   Sexual activity: Not on file  Other Topics Concern   Not on file  Social History Narrative   Not on file   Social Drivers of Health   Financial Resource Strain: Low Risk  (10/06/2024)   Overall Financial Resource Strain (CARDIA)    Difficulty of Paying Living Expenses: Not very hard  Food Insecurity: No Food Insecurity (10/06/2024)   Hunger Vital Sign    Worried About Running Out of Food in the Last Year: Never true    Ran Out of Food in the Last Year: Never true  Transportation Needs: No Transportation Needs (10/06/2024)   PRAPARE - Administrator, Civil Service (Medical): No    Lack of Transportation (Non-Medical): No  Physical Activity: Insufficiently Active (10/06/2024)   Exercise Vital Sign    Days of Exercise per Week: 3 days    Minutes of Exercise per Session: 30 min  Stress: No Stress Concern Present (10/06/2024)   [employer] of Occupational Health - Occupational Stress Questionnaire    Feeling of Stress: Only a little  Social Connections: Moderately Integrated (06/02/2024)   Social Connection and Isolation Panel    Frequency of Communication with Friends and Family: More than three times a week    Frequency of Social Gatherings with Friends and Family: Once a week    Attends Religious Services: More than 4 times per year    Active Member of [employer] or Organizations: Yes    Attends Banker Meetings: 1 to 4 times per year    Marital Status: Never married  Intimate Partner Violence: Not At Risk (10/06/2024)   Humiliation, Afraid,  Rape, and Kick questionnaire    Fear of Current or Ex-Partner: No    Emotionally Abused: No    Physically Abused: No    Sexually Abused: No    Family History  Problem Relation Age of Onset   Hyperlipidemia Mother    Hypertension Mother    Hyperlipidemia Father    Diabetes Paternal Uncle     Past Surgical History:  Procedure Laterality Date   WISDOM TOOTH EXTRACTION      ROS: Review of Systems Negative except as stated above  PHYSICAL EXAM: BP 117/77 (BP Location: Left Arm, Patient Position: Sitting, Cuff Size: Normal)   Pulse 85   Temp 98 F (36.7 C) (Oral)  Ht 5' 2 (1.575 m)   Wt 160 lb (72.6 kg)   SpO2 99%   BMI 29.26 kg/m   Wt Readings from Last 3 Encounters:  10/06/24 160 lb (72.6 kg)  06/04/24 164 lb (74.4 kg)  05/07/24 166 lb 4.8 oz (75.4 kg)    Physical Exam  General appearance - alert, well appearing, young AAF and in no distress Mental status - normal mood, behavior, speech, dress, motor activity, and thought processes Neck - supple, no significant adenopathy Chest - clear to auscultation, no wheezes, rales or rhonchi, symmetric air entry Heart - normal rate, regular rhythm, normal S1, S2, no murmurs, rubs, clicks or gallops Extremities - peripheral pulses normal, no pedal edema, no clubbing or cyanosis      Latest Ref Rng & Units 09/16/2023    3:57 PM 12/08/2020    3:00 PM 01/07/2019    4:38 PM  CMP  Glucose 70 - 99 mg/dL 817  789  87   BUN 6 - 20 mg/dL 10  13  9    Creatinine 0.57 - 1.00 mg/dL 9.22  9.19  9.18   Sodium 134 - 144 mmol/L 137  136  141   Potassium 3.5 - 5.2 mmol/L 4.9  5.0  4.6   Chloride 96 - 106 mmol/L 101  97  104   CO2 20 - 29 mmol/L 22  22  23    Calcium  8.7 - 10.2 mg/dL 89.9  89.7  89.9   Total Protein 6.0 - 8.5 g/dL 7.5  7.9  7.6   Total Bilirubin 0.0 - 1.2 mg/dL 0.2  <9.7  <9.7   Alkaline Phos 44 - 121 IU/L 132  125  108   AST 0 - 40 IU/L 23  29  16    ALT 0 - 32 IU/L 41  23  30    Lipid Panel     Component Value  Date/Time   CHOL 160 06/04/2024 1132   TRIG 62 06/04/2024 1132   HDL 58 06/04/2024 1132   CHOLHDL 2.8 06/04/2024 1132   LDLCALC 90 06/04/2024 1132    CBC    Component Value Date/Time   WBC 4.8 06/04/2024 1132   WBC 7.5 11/19/2007 1055   RBC 5.12 06/04/2024 1132   RBC 4.81 11/19/2007 1055   HGB 10.6 (L) 06/04/2024 1132   HCT 38.1 06/04/2024 1132   PLT 440 06/04/2024 1132   MCV 74 (L) 06/04/2024 1132   MCH 20.7 (L) 06/04/2024 1132   MCHC 27.8 (L) 06/04/2024 1132   MCHC 32.7 11/19/2007 1055   RDW 18.6 (H) 06/04/2024 1132   LYMPHSABS 2.2 01/07/2019 1638   EOSABS 0.0 01/07/2019 1638   BASOSABS 0.0 01/07/2019 1638    ASSESSMENT AND PLAN: 1. Type 2 diabetes mellitus with other specified complication, without long-term current use of insulin (HCC) (Primary) At goal. Continue Jardiance  10 mg daily. She would like to achieve greater weight loss.  She is tolerating the Mounjaro  without significant side effects.  We agreed to increase the Mounjaro  to 12.5 mg once a week. Commended her on changes in eating habits.  Be mindful of portion sizes and avoid sugary sweets during the holiday season.  Continue regular exercise. - empagliflozin  (JARDIANCE ) 10 MG TABS tablet; Take 1 tablet (10 mg total) by mouth daily before breakfast.  Dispense: 90 tablet; Refill: 1 - tirzepatide  (MOUNJARO ) 12.5 MG/0.5ML Pen; Inject 12.5 mg into the skin once a week. Start after completing the 10 mg dose  Dispense: 6 mL; Refill: 1 -  Microalbumin / creatinine urine ratio  2. Long-term (current) use of injectable non-insulin antidiabetic drugs 3. Diabetes mellitus treated with oral medication (HCC) See #1 above. - POCT glycosylated hemoglobin (Hb A1C) - POCT glucose (manual entry)  4. Hypertension associated with type 2 diabetes mellitus (HCC) At goal on Cozaar  50 mg daily  5. Hyperlipidemia associated with type 2 diabetes mellitus (HCC) Continue Atorvastatin   6. Need for Tdap vaccination Given  today    Patient was given the opportunity to ask questions.  Patient verbalized understanding of the plan and was able to repeat key elements of the plan.   This documentation was completed using Paediatric nurse.  Any transcriptional errors are unintentional.  Orders Placed This Encounter  Procedures   Tdap vaccine greater than or equal to 7yo IM   Microalbumin / creatinine urine ratio   POCT glycosylated hemoglobin (Hb A1C)   POCT glucose (manual entry)     Requested Prescriptions   Signed Prescriptions Disp Refills   empagliflozin  (JARDIANCE ) 10 MG TABS tablet 90 tablet 1    Sig: Take 1 tablet (10 mg total) by mouth daily before breakfast.   tirzepatide  (MOUNJARO ) 12.5 MG/0.5ML Pen 6 mL 1    Sig: Inject 12.5 mg into the skin once a week. Start after completing the 10 mg dose    Return in about 4 months (around 02/03/2025).  Barnie Louder, MD, FACP

## 2024-10-07 ENCOUNTER — Ambulatory Visit: Payer: Self-pay | Admitting: Internal Medicine

## 2024-10-07 LAB — MICROALBUMIN / CREATININE URINE RATIO
Creatinine, Urine: 33.5 mg/dL
Microalb/Creat Ratio: <9 mg/g{creat} (ref 0–29)
Microalbumin, Urine: <3 ug/mL

## 2024-11-29 ENCOUNTER — Other Ambulatory Visit: Payer: Self-pay | Admitting: Internal Medicine

## 2024-11-29 DIAGNOSIS — E669 Obesity, unspecified: Secondary | ICD-10-CM

## 2025-02-08 ENCOUNTER — Ambulatory Visit: Admitting: Internal Medicine
